# Patient Record
Sex: Female | Born: 1985 | Race: Black or African American | Hispanic: No | Marital: Single | Smoking: Current some day smoker
Health system: Southern US, Community
[De-identification: ages and names within clinical notes are randomized; demographics above are authoritative.]

## PROBLEM LIST (undated history)

## (undated) DIAGNOSIS — I10 Essential (primary) hypertension: Secondary | ICD-10-CM

## (undated) HISTORY — PX: HERNIA REPAIR: SHX51

## (undated) HISTORY — PX: TONSILLECTOMY: SUR1361

---

## 2007-11-23 ENCOUNTER — Emergency Department: Payer: Self-pay | Admitting: Emergency Medicine

## 2009-11-09 ENCOUNTER — Emergency Department (HOSPITAL_BASED_OUTPATIENT_CLINIC_OR_DEPARTMENT_OTHER): Admission: EM | Admit: 2009-11-09 | Discharge: 2009-11-09 | Payer: Self-pay | Admitting: Emergency Medicine

## 2012-02-22 ENCOUNTER — Emergency Department (HOSPITAL_BASED_OUTPATIENT_CLINIC_OR_DEPARTMENT_OTHER)
Admission: EM | Admit: 2012-02-22 | Discharge: 2012-02-22 | Disposition: A | Discharge status: Home / Self Care | Payer: Medicaid Other | Attending: Emergency Medicine | Admitting: Emergency Medicine

## 2012-02-22 ENCOUNTER — Encounter (HOSPITAL_BASED_OUTPATIENT_CLINIC_OR_DEPARTMENT_OTHER): Payer: Self-pay

## 2012-02-22 DIAGNOSIS — B349 Viral infection, unspecified: Secondary | ICD-10-CM

## 2012-02-22 DIAGNOSIS — J029 Acute pharyngitis, unspecified: Secondary | ICD-10-CM

## 2012-02-22 DIAGNOSIS — R079 Chest pain, unspecified: Secondary | ICD-10-CM | POA: Insufficient documentation

## 2012-02-22 DIAGNOSIS — R42 Dizziness and giddiness: Secondary | ICD-10-CM | POA: Insufficient documentation

## 2012-02-22 LAB — RAPID STREP SCREEN (MED CTR MEBANE ONLY): Streptococcus, Group A Screen (Direct): NEGATIVE

## 2012-02-22 NOTE — ED Notes (Signed)
Pt reports not feeling well today.  She has chest pain, "light headedness" and a sore throat since 12:00 today.

## 2012-02-22 NOTE — Discharge Instructions (Signed)
Viral Infections  A viral infection can be caused by different types of viruses.Most viral infections are not serious and resolve on their own. However, some infections may cause severe symptoms and may lead to further complications.  SYMPTOMS  Viruses can frequently cause:   Minor sore throat.   Aches and pains.   Headaches.   Runny nose.   Different types of rashes.   Watery eyes.   Tiredness.   Cough.   Loss of appetite.   Gastrointestinal infections, resulting in nausea, vomiting, and diarrhea.  These symptoms do not respond to antibiotics because the infection is not caused by bacteria. However, you might catch a bacterial infection following the viral infection. This is sometimes called a "superinfection." Symptoms of such a bacterial infection may include:   Worsening sore throat with pus and difficulty swallowing.   Swollen neck glands.   Chills and a high or persistent fever.   Severe headache.   Tenderness over the sinuses.   Persistent overall ill feeling (malaise), muscle aches, and tiredness (fatigue).   Persistent cough.   Yellow, green, or brown mucus production with coughing.  HOME CARE INSTRUCTIONS    Only take over-the-counter or prescription medicines for pain, discomfort, diarrhea, or fever as directed by your caregiver.   Drink enough water and fluids to keep your urine clear or pale yellow. Sports drinks can provide valuable electrolytes, sugars, and hydration.   Get plenty of rest and maintain proper nutrition. Soups and broths with crackers or rice are fine.  SEEK IMMEDIATE MEDICAL CARE IF:    You have severe headaches, shortness of breath, chest pain, neck pain, or an unusual rash.   You have uncontrolled vomiting, diarrhea, or you are unable to keep down fluids.   You or your child has an oral temperature above 102 F (38.9 C), not controlled by medicine.   Your baby is older than 3 months with a rectal temperature of 102 F (38.9 C) or higher.   Your baby is 3  months old or younger with a rectal temperature of 100.4 F (38 C) or higher.  MAKE SURE YOU:    Understand these instructions.   Will watch your condition.   Will get help right away if you are not doing well or get worse.  Document Released: 06/20/2005 Document Revised: 08/30/2011 Document Reviewed: 01/15/2011  [employer] Patient Information 2012 [employer].

## 2012-02-22 NOTE — ED Provider Notes (Signed)
History     CSN: [id number]  Arrival date & time 02/22/12  1648   First MD Initiated Contact with Patient 02/22/12 1702      Chief Complaint  Patient presents with  . Chest Pain  . Dizziness    (Consider location/radiation/quality/duration/timing/severity/associated sxs/prior treatment) HPI Comments: Patient states started today with sore throat, tightness in chest, "just doesn't feel well".  No fevers or chills.  No n/v/d.  Denies shortness of breath  Patient is a 26 y.o. female presenting with chest pain. The history is provided by the patient.  Chest Pain Episode onset: this morning. Chest pain occurs constantly. The chest pain is worsening. The severity of the pain is moderate. The quality of the pain is described as aching. The pain does not radiate.     History reviewed. No pertinent past medical history.  History reviewed. No pertinent past surgical history.  No family history on file.  History  Substance Use Topics  . Smoking status: Passive Smoker  . Smokeless tobacco: Never Used  . Alcohol Use: Yes     occasional    OB History    Grav Para Term Preterm Abortions TAB SAB Ect Mult Living                  Review of Systems  Cardiovascular: Positive for chest pain.  All other systems reviewed and are negative.    Allergies  Review of patient's allergies indicates no known allergies.  Home Medications  No current outpatient prescriptions on file.  BP 125/78  Pulse 98  Temp(Src) 97.8 F (36.6 C) (Oral)  Resp 16  Ht 5\' 6"  (1.676 m)  Wt 224 lb (101.606 kg)  BMI 36.15 kg/m2  SpO2 98%  Physical Exam  Nursing note and vitals reviewed. Constitutional: She is oriented to person, place, and time. She appears well-developed and well-nourished. No distress.  HENT:  Head: Normocephalic and atraumatic.  Neck: Normal range of motion. Neck supple.  Cardiovascular: Normal rate and regular rhythm.  Exam reveals no gallop and no friction rub.   No murmur  heard. Pulmonary/Chest: Effort normal and breath sounds normal. No respiratory distress. She has no wheezes.  Abdominal: Soft. Bowel sounds are normal. She exhibits no distension. There is no tenderness.  Musculoskeletal: Normal range of motion.  Neurological: She is alert and oriented to person, place, and time.  Skin: Skin is warm and dry. She is not diaphoretic.    ED Course  Procedures (including critical care time)  Labs Reviewed - No data to display No results found.   No diagnosis found.   Date: 02/22/2012  Rate: 75  Rhythm: normal sinus rhythm  QRS Axis: normal  Intervals: normal  ST/T Wave abnormalities: normal  Conduction Disutrbances:none  Narrative Interpretation:   Old EKG Reviewed: none available    MDM  The strep test is negative.  The patient does not appear in any discomfort or distress.  She is not ill-appearing.  The ekg looks fine and the strep test is negative.  I am positive there is no cardiac issue and that her symptoms are most likely viral in nature.  She will be discharged with rest, time, and follow up if not improving.        Geoffery Lyons, MD 02/22/12 [phone]

## 2012-10-14 ENCOUNTER — Encounter (HOSPITAL_BASED_OUTPATIENT_CLINIC_OR_DEPARTMENT_OTHER): Payer: Self-pay | Admitting: *Deleted

## 2012-10-14 ENCOUNTER — Emergency Department (HOSPITAL_BASED_OUTPATIENT_CLINIC_OR_DEPARTMENT_OTHER)
Admission: EM | Admit: 2012-10-14 | Discharge: 2012-10-14 | Disposition: A | Discharge status: Home / Self Care | Payer: Self-pay | Attending: Emergency Medicine | Admitting: Emergency Medicine

## 2012-10-14 DIAGNOSIS — IMO0001 Reserved for inherently not codable concepts without codable children: Secondary | ICD-10-CM | POA: Insufficient documentation

## 2012-10-14 DIAGNOSIS — R059 Cough, unspecified: Secondary | ICD-10-CM | POA: Insufficient documentation

## 2012-10-14 DIAGNOSIS — R0982 Postnasal drip: Secondary | ICD-10-CM | POA: Insufficient documentation

## 2012-10-14 DIAGNOSIS — J029 Acute pharyngitis, unspecified: Secondary | ICD-10-CM | POA: Insufficient documentation

## 2012-10-14 DIAGNOSIS — R5381 Other malaise: Secondary | ICD-10-CM | POA: Insufficient documentation

## 2012-10-14 DIAGNOSIS — R52 Pain, unspecified: Secondary | ICD-10-CM | POA: Insufficient documentation

## 2012-10-14 DIAGNOSIS — R05 Cough: Secondary | ICD-10-CM | POA: Insufficient documentation

## 2012-10-14 NOTE — ED Provider Notes (Signed)
History     CSN: [id number]  Arrival date & time 10/14/12  0865   First MD Initiated Contact with Patient 10/14/12 [phone]      Chief Complaint  Patient presents with  . Sore Throat  . Generalized Body Aches    (Consider location/radiation/quality/duration/timing/severity/associated sxs/prior treatment) HPI Comments: Patient presents with a three-day history of sore throat and dry cough. She states her symptoms actually a little bit better this morning but she does find it checked out. She denies any nasal congestion although she's had a little bit of congestion in her throat. She denies any chest congestion but has had a dry cough. She denies any shortness of breath. She has had some subjective fevers and body aches. She's not sure whether the body aches are from the illness or a new workout classes she started 2 days ago. She denies any nausea vomiting or diarrhea.  Patient is a 27 y.o. female presenting with pharyngitis.  Sore Throat Pertinent negatives include no chest pain, no abdominal pain, no headaches and no shortness of breath.    History reviewed. No pertinent past medical history.  Past Surgical History  Procedure Date  . Tonsillectomy     History reviewed. No pertinent family history.  History  Substance Use Topics  . Smoking status: Passive Smoke Exposure - Never Smoker  . Smokeless tobacco: Never Used  . Alcohol Use: 2.0 oz/week    4 drink(s) per week    OB History    Grav Para Term Preterm Abortions TAB SAB Ect Mult Living                  Review of Systems  Constitutional: Positive for fatigue. Negative for fever, chills and diaphoresis.  HENT: Positive for sore throat and postnasal drip. Negative for congestion, rhinorrhea, sneezing and trouble swallowing.   Eyes: Negative.   Respiratory: Negative for cough, chest tightness and shortness of breath.   Cardiovascular: Negative for chest pain and leg swelling.  Gastrointestinal: Negative for nausea,  vomiting, abdominal pain, diarrhea and blood in stool.  Genitourinary: Negative for frequency, hematuria, flank pain and difficulty urinating.  Musculoskeletal: Positive for myalgias. Negative for back pain and arthralgias.  Skin: Negative for rash.  Neurological: Negative for dizziness, speech difficulty, weakness, numbness and headaches.    Allergies  Review of patient's allergies indicates no known allergies.  Home Medications   Current Outpatient Rx  Name  Route  Sig  Dispense  Refill  . MULTIVITAMINS PO CAPS   Oral   Take 1 capsule by mouth daily.           BP 139/84  Pulse 74  Temp 98.6 F (37 C) (Oral)  Resp 18  Ht 5\' 5"  (1.651 m)  Wt 239 lb (108.41 kg)  BMI 39.77 kg/m2  SpO2 100%  Physical Exam  Constitutional: She is oriented to person, place, and time. She appears well-developed and well-nourished.  HENT:  Head: Normocephalic and atraumatic.  Mouth/Throat: Oropharynx is clear and moist.  Eyes: Pupils are equal, round, and reactive to light.  Neck: Normal range of motion. Neck supple.  Cardiovascular: Normal rate, regular rhythm and normal heart sounds.   Pulmonary/Chest: Effort normal and breath sounds normal. No respiratory distress. She has no wheezes. She has no rales. She exhibits no tenderness.  Abdominal: Soft. Bowel sounds are normal. There is no tenderness. There is no rebound and no guarding.  Musculoskeletal: Normal range of motion. She exhibits no edema.  Lymphadenopathy:  She has no cervical adenopathy.  Neurological: She is alert and oriented to person, place, and time.  Skin: Skin is warm and dry. No rash noted.  Psychiatric: She has a normal mood and affect.    ED Course  Procedures (including critical care time)  Results for orders placed during the hospital encounter of 10/14/12  RAPID STREP SCREEN      Component Value Range   Streptococcus, Group A Screen (Direct) NEGATIVE  NEGATIVE   No results found.   No results  found.   1. Pharyngitis       MDM  Patient presents with viral type symptoms. She is well-appearing with no fever and no shortness of breath. Her strep test is negative. She's out of the window for Tamiflu and I would not feel this is indicated even she was in the window because her symptoms seemed to be improving. I advised in symptomatic care and advised to return if her symptoms worsen or she has any shortness of breath.        Rolan Bucco, MD 10/14/12 [phone]

## 2012-10-14 NOTE — ED Notes (Signed)
Pt d/c home- no new rx given 

## 2012-10-14 NOTE — ED Notes (Signed)
MD at bedside. 

## 2012-10-14 NOTE — ED Notes (Signed)
Sore throat and cough states no fever feels better today states she has had some body aches but also took a new exercise class on Saturday too

## 2012-11-17 ENCOUNTER — Emergency Department (HOSPITAL_BASED_OUTPATIENT_CLINIC_OR_DEPARTMENT_OTHER)
Admission: EM | Admit: 2012-11-17 | Discharge: 2012-11-17 | Disposition: A | Discharge status: Home / Self Care | Payer: Self-pay | Attending: Emergency Medicine | Admitting: Emergency Medicine

## 2012-11-17 ENCOUNTER — Encounter (HOSPITAL_BASED_OUTPATIENT_CLINIC_OR_DEPARTMENT_OTHER): Payer: Self-pay | Admitting: *Deleted

## 2012-11-17 DIAGNOSIS — Y939 Activity, unspecified: Secondary | ICD-10-CM | POA: Insufficient documentation

## 2012-11-17 DIAGNOSIS — Y929 Unspecified place or not applicable: Secondary | ICD-10-CM | POA: Insufficient documentation

## 2012-11-17 DIAGNOSIS — W57XXXA Bitten or stung by nonvenomous insect and other nonvenomous arthropods, initial encounter: Secondary | ICD-10-CM | POA: Insufficient documentation

## 2012-11-17 DIAGNOSIS — T148 Other injury of unspecified body region: Secondary | ICD-10-CM | POA: Insufficient documentation

## 2012-11-17 MED ORDER — TRIAMCINOLONE ACETONIDE 0.1 % EX CREA: TOPICAL_CREAM | Freq: Two times a day (BID) | CUTANEOUS | Status: DC

## 2012-11-17 NOTE — ED Provider Notes (Signed)
History    This chart was scribed for non-physician practitioner working with Derwood Kaplan, MD by Donne Anon, ED Scribe. This patient was seen in room MH03/MH03 and the patient's care was started at 1634.    CSN: [id number]  Arrival date & time 11/17/12  1530   First MD Initiated Contact with Patient 11/17/12 1634      Chief Complaint  Patient presents with  . Rash     The history is provided by the patient. No language interpreter was used.  Virginia Wagner is a 27 y.o. female who presents to the Emergency Department complaining of gradual onset, gradually worsening, constant, moderate rash which began on her left hand 4 days ago and yesterday spread to her left arm and left ankle. She reports associated itching. She denies any other pain. She denies any insect bites. 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   History reviewed. No pertinent past medical history.  Past Surgical History  Procedure Laterality Date  . Tonsillectomy    . Hernia repair      No family history on file.  History  Substance Use Topics  . Smoking status: Passive Smoke Exposure - Never Smoker  . Smokeless tobacco: Never Used  . Alcohol Use: 2.0 oz/week    4 drink(s) per week    Review of Systems  Skin: Positive for rash.  All other systems reviewed and are negative.    Allergies  Review of patient's allergies indicates no known allergies.  Home Medications   Current Outpatient Rx  Name  Route  Sig  Dispense  Refill  . Multiple Vitamin (MULTIVITAMIN) capsule   Oral   Take 1 capsule by mouth daily.           BP 141/87  Pulse 93  Temp(Src) 98.8 F (37.1 C) (Oral)  Resp 18  SpO2 100%  Physical Exam  Nursing note and vitals reviewed. Constitutional: She is oriented to person, place, and time. She appears well-developed and well-nourished. No distress.  HENT:  Head: Normocephalic and atraumatic.  Eyes:  EOM are normal.  Neck: Neck supple. No tracheal deviation present.  Cardiovascular: Normal rate.   Pulmonary/Chest: Effort normal. No respiratory distress.  Musculoskeletal: Normal range of motion.  Neurological: She is alert and oriented to person, place, and time.  Skin: Skin is warm and dry.  Red raised dime sized areas; 3 on left arm, 1 dime sized area on left ankle. 2 pinpoint areas on left 5th finger.  Psychiatric: She has a normal mood and affect. Her behavior is normal.    ED Course  Procedures (including critical care time) DIAGNOSTIC STUDIES: Oxygen Saturation is 100% on room air, normal by my interpretation.    COORDINATION OF CARE: 5:31 PM Discussed treatment plan which includes Benadryl and cortisone cream with pt at bedside and pt agreed to plan.     Labs Reviewed - No data to display No results found.   1. Insect bites     triamcinolone to rash  MDM   I personally performed the services in this documentation, which was scribed in my presence.  The recorded information has been reviewed and considered.   Barnet Pall.        Lonia Skinner Roy, [place] 11/17/12 [phone]

## 2012-11-17 NOTE — ED Notes (Signed)
Pt reports she noticed bumps on her left hand on Friday- now on left arm and left ankle- c/o itching, ?insect bites

## 2012-11-18 NOTE — ED Provider Notes (Signed)
Medical screening examination/treatment/procedure(s) were performed by non-physician practitioner and as supervising physician I was immediately available for consultation/collaboration.  Brock Mokry, MD 11/18/12 0014 

## 2012-12-01 ENCOUNTER — Encounter (HOSPITAL_BASED_OUTPATIENT_CLINIC_OR_DEPARTMENT_OTHER): Payer: Self-pay | Admitting: Family Medicine

## 2012-12-01 ENCOUNTER — Emergency Department (HOSPITAL_BASED_OUTPATIENT_CLINIC_OR_DEPARTMENT_OTHER)
Admission: EM | Admit: 2012-12-01 | Discharge: 2012-12-01 | Disposition: A | Discharge status: Home / Self Care | Payer: Self-pay | Attending: Emergency Medicine | Admitting: Emergency Medicine

## 2012-12-01 DIAGNOSIS — F17200 Nicotine dependence, unspecified, uncomplicated: Secondary | ICD-10-CM | POA: Insufficient documentation

## 2012-12-01 DIAGNOSIS — M542 Cervicalgia: Secondary | ICD-10-CM | POA: Insufficient documentation

## 2012-12-01 DIAGNOSIS — Z79899 Other long term (current) drug therapy: Secondary | ICD-10-CM | POA: Insufficient documentation

## 2012-12-01 MED ORDER — IBUPROFEN 600 MG PO TABS: 600.0000 mg | ORAL_TABLET | Freq: Three times a day (TID) | ORAL | Status: DC | PRN

## 2012-12-01 MED ORDER — IBUPROFEN 400 MG PO TABS
600.0000 mg | ORAL_TABLET | Freq: Once | ORAL | Status: AC
  Administered 2012-12-01: 600 mg via ORAL
  Filled 2012-12-01: qty 1

## 2012-12-01 NOTE — ED Provider Notes (Signed)
History     CSN: [id number]  Arrival date & time 12/01/12  1038   First MD Initiated Contact with Patient 12/01/12 1125      Chief Complaint  Patient presents with  . Neck Pain    (Consider location/radiation/quality/duration/timing/severity/associated sxs/prior treatment) Patient is a 27 y.o. female presenting with neck pain. The history is provided by the patient.  Neck Pain Associated symptoms: no chest pain, no fever and no headaches   pt c/o right anterior neck pain for the past couple days. Gradual onset. Mild to mod. Constant.  No abrupt change today. States occasionally worse w turning neck side to side or with pressure on area. No visible swelling. No skin changes or erythema. Denies sore throat or trouble swallowing. No change in voice. No cough or trouble breathing. No dental pain or pain chewing. No ear pain. Denies hx same.    History reviewed. No pertinent past medical history.  Past Surgical History  Procedure Laterality Date  . Tonsillectomy    . Hernia repair      No family history on file.  History  Substance Use Topics  . Smoking status: Current Every Day Smoker  . Smokeless tobacco: Never Used  . Alcohol Use: 2.0 oz/week    4 drink(s) per week    OB History   Grav Para Term Preterm Abortions TAB SAB Ect Mult Living                  Review of Systems  Constitutional: Negative for fever.  HENT: Positive for neck pain. Negative for sore throat, trouble swallowing, dental problem and voice change.   Eyes: Negative for pain and redness.  Respiratory: Negative for cough and shortness of breath.   Cardiovascular: Negative for chest pain.  Gastrointestinal: Negative for nausea and vomiting.  Skin: Negative for rash.  Neurological: Negative for headaches.    Allergies  Review of patient's allergies indicates no known allergies.  Home Medications   Current Outpatient Rx  Name  Route  Sig  Dispense  Refill  . acetaminophen (TYLENOL) 325 MG  tablet   Oral   Take 650 mg by mouth every 6 (six) hours as needed for pain.         . Multiple Vitamin (MULTIVITAMIN) capsule   Oral   Take 1 capsule by mouth daily.         Marland Kitchen triamcinolone cream (KENALOG) 0.1 %   Topical   Apply topically 2 (two) times daily.   30 g   0     BP 133/84  Pulse 80  Temp(Src) 98.3 F (36.8 C) (Oral)  Resp 20  Ht 5\' 5"  (1.651 m)  Wt 234 lb (106.142 kg)  BMI 38.94 kg/m2  SpO2 99%  Physical Exam  Nursing note and vitals reviewed. Constitutional: She appears well-developed and well-nourished. No distress.  HENT:  Nose: Nose normal.  Mouth/Throat: Oropharynx is clear and moist.  No pharyngeal swelling. No trismus. No dental pain or tenderness.   Eyes: Conjunctivae are normal. No scleral icterus.  Neck: Normal range of motion. Neck supple. No tracheal deviation present. No thyromegaly present.  ?mildly tender right anterior cervical, submandibular lymph node. No mass or abscess felt.   Cardiovascular: Normal rate.   Pulmonary/Chest: Effort normal and breath sounds normal. No respiratory distress.  No stridor  Abdominal: Normal appearance. She exhibits no distension.  Musculoskeletal: She exhibits no edema.  Neurological: She is alert.  Skin: Skin is warm and dry. No rash noted. She  is not diaphoretic.  Psychiatric: She has a normal mood and affect.    ED Course  Procedures (including critical care time)    MDM  Motrin po.  No abscess. No cellulitis. No mass.  Discussed diff dx incl possible lymphadenitis ?viral. No throat pain or swelling. No dental pain. No ear pain. No trouble breathing or swallowing, no change in voice.  Pt appears stable for d/c.        Suzi Roots, MD 12/01/12 1146

## 2012-12-01 NOTE — ED Notes (Signed)
Pt c/o right sided neck pain, tender to palpation since Friday. Pt sts neck feels swollen.

## 2012-12-14 ENCOUNTER — Emergency Department (HOSPITAL_BASED_OUTPATIENT_CLINIC_OR_DEPARTMENT_OTHER): Payer: Self-pay

## 2012-12-14 ENCOUNTER — Emergency Department (HOSPITAL_BASED_OUTPATIENT_CLINIC_OR_DEPARTMENT_OTHER)
Admission: EM | Admit: 2012-12-14 | Discharge: 2012-12-14 | Disposition: A | Discharge status: Home / Self Care | Payer: Self-pay | Attending: Emergency Medicine | Admitting: Emergency Medicine

## 2012-12-14 ENCOUNTER — Encounter (HOSPITAL_BASED_OUTPATIENT_CLINIC_OR_DEPARTMENT_OTHER): Payer: Self-pay | Admitting: *Deleted

## 2012-12-14 DIAGNOSIS — Z792 Long term (current) use of antibiotics: Secondary | ICD-10-CM | POA: Insufficient documentation

## 2012-12-14 DIAGNOSIS — Z79899 Other long term (current) drug therapy: Secondary | ICD-10-CM | POA: Insufficient documentation

## 2012-12-14 DIAGNOSIS — F17200 Nicotine dependence, unspecified, uncomplicated: Secondary | ICD-10-CM | POA: Insufficient documentation

## 2012-12-14 DIAGNOSIS — X500XXA Overexertion from strenuous movement or load, initial encounter: Secondary | ICD-10-CM | POA: Insufficient documentation

## 2012-12-14 DIAGNOSIS — Y9301 Activity, walking, marching and hiking: Secondary | ICD-10-CM | POA: Insufficient documentation

## 2012-12-14 DIAGNOSIS — Y929 Unspecified place or not applicable: Secondary | ICD-10-CM | POA: Insufficient documentation

## 2012-12-14 DIAGNOSIS — S93409A Sprain of unspecified ligament of unspecified ankle, initial encounter: Secondary | ICD-10-CM | POA: Insufficient documentation

## 2012-12-14 DIAGNOSIS — W19XXXA Unspecified fall, initial encounter: Secondary | ICD-10-CM | POA: Insufficient documentation

## 2012-12-14 MED ORDER — HYDROCODONE-ACETAMINOPHEN 5-325 MG PO TABS: 1.0000 | ORAL_TABLET | Freq: Four times a day (QID) | ORAL | Status: DC | PRN

## 2012-12-14 MED ORDER — IBUPROFEN 800 MG PO TABS
ORAL_TABLET | ORAL | Status: AC
  Filled 2012-12-14: qty 1

## 2012-12-14 MED ORDER — IBUPROFEN 800 MG PO TABS
800.0000 mg | ORAL_TABLET | Freq: Once | ORAL | Status: AC
  Administered 2012-12-14: 800 mg via ORAL

## 2012-12-14 NOTE — ED Notes (Signed)
Patient twisted her R ankle last night and pain has grown worse

## 2012-12-14 NOTE — ED Provider Notes (Addendum)
History     CSN: [id number]  Arrival date & time 12/14/12  4782   First MD Initiated Contact with Patient 12/14/12 1007      Chief Complaint  Patient presents with  . Ankle Pain    (Consider location/radiation/quality/duration/timing/severity/associated sxs/prior treatment) Patient is a 27 y.o. female presenting with ankle pain. The history is provided by the patient.  Ankle Pain Location:  Ankle Time since incident:  1 day Injury: yes   Mechanism of injury comment:  Walking in heels and her foot twisted outward and she fell Ankle location:  R ankle Pain details:    Quality:  Aching, shooting, throbbing and sharp   Radiates to:  Does not radiate   Severity:  Moderate   Onset quality:  Sudden   Duration:  1 day   Timing:  Constant   Progression:  Unchanged Chronicity:  New Prior injury to area:  No Relieved by:  Rest Worsened by:  Activity and bearing weight Ineffective treatments:  None tried Associated symptoms: swelling   Associated symptoms: no numbness     History reviewed. No pertinent past medical history.  Past Surgical History  Procedure Laterality Date  . Tonsillectomy    . Hernia repair      No family history on file.  History  Substance Use Topics  . Smoking status: Current Every Day Smoker  . Smokeless tobacco: Never Used  . Alcohol Use: 2.0 oz/week    4 drink(s) per week    OB History   Grav Para Term Preterm Abortions TAB SAB Ect Mult Living                  Review of Systems  Neurological: Negative for weakness and numbness.  All other systems reviewed and are negative.    Allergies  Review of patient's allergies indicates no known allergies.  Home Medications   Current Outpatient Rx  Name  Route  Sig  Dispense  Refill  . CLINDAMYCIN HCL PO   Oral   Take by mouth.         Marland Kitchen acetaminophen (TYLENOL) 325 MG tablet   Oral   Take 650 mg by mouth every 6 (six) hours as needed for pain.         Marland Kitchen ibuprofen (ADVIL,MOTRIN)  600 MG tablet   Oral   Take 1 tablet (600 mg total) by mouth every 8 (eight) hours as needed for pain. Take with food.   20 tablet   0   . Multiple Vitamin (MULTIVITAMIN) capsule   Oral   Take 1 capsule by mouth daily.         Marland Kitchen triamcinolone cream (KENALOG) 0.1 %   Topical   Apply topically 2 (two) times daily.   30 g   0     BP 129/81  Pulse 93  Temp(Src) 98.5 F (36.9 C) (Oral)  Resp 18  SpO2 99%  Physical Exam  Constitutional: She appears well-developed and well-nourished. No distress.  HENT:  Head: Normocephalic and atraumatic.  Eyes: EOM are normal. Pupils are equal, round, and reactive to light.  Cardiovascular: Normal rate.   Pulmonary/Chest: Effort normal.  Musculoskeletal:       Right ankle: She exhibits swelling. She exhibits normal range of motion, no deformity and normal pulse. No lateral malleolus, no medial malleolus, no posterior TFL, no head of 5th metatarsal and no proximal fibula tenderness found.       Feet:  Neurological: She is alert.  Skin: Skin is  warm and dry. No rash noted. No erythema.    ED Course  Procedures (including critical care time)  Labs Reviewed - No data to display Dg Ankle Complete Right  12/14/2012  *RADIOLOGY REPORT*  Clinical Data: Pain post injury.  RIGHT ANKLE - COMPLETE 3+ VIEW  Comparison: None.  Findings: Ankle mortise intact. Negative for fracture, dislocation, or other acute abnormality.  Normal alignment and mineralization. No significant degenerative change.  Regional soft tissues unremarkable.  IMPRESSION:  Negative   Original Report Authenticated By: D. Andria Rhein, MD      1. Ankle sprain and strain, right, initial encounter       MDM   Patient with a mechanical injury to her right ankle last night while she was wearing heels and fell. She has pain over the fibulotalar ligament with swelling. No lateral or medial malleolar tenderness. No tenderness at the base of her fifth metatarsal. Neurovascularly  intact. No fibular head pain.  Films are negative for acute injury and feel this is a ligamental. Patient placed in an ASO and given crutches and pain control.      Gwyneth Sprout, MD 12/14/12 1037  Gwyneth Sprout, MD 12/14/12 1038

## 2012-12-23 NOTE — ED Notes (Signed)
Ms. Virginia Wagner's boss, Virginia Wagner, called to question work note.  A note was given to the pt on 12/22/12 which Dr. Anitra Lauth had hand written on the note that is was ok for the pt to return to work on 12/22/12 with no restrictions.  Note was signed by myself and Dr. Anitra Lauth.

## 2013-06-14 ENCOUNTER — Encounter (HOSPITAL_BASED_OUTPATIENT_CLINIC_OR_DEPARTMENT_OTHER): Payer: Self-pay | Admitting: *Deleted

## 2013-06-14 ENCOUNTER — Emergency Department (HOSPITAL_BASED_OUTPATIENT_CLINIC_OR_DEPARTMENT_OTHER): Payer: Medicaid Other

## 2013-06-14 ENCOUNTER — Emergency Department (HOSPITAL_BASED_OUTPATIENT_CLINIC_OR_DEPARTMENT_OTHER)
Admission: EM | Admit: 2013-06-14 | Discharge: 2013-06-14 | Disposition: A | Discharge status: Home / Self Care | Payer: Medicaid Other | Attending: Emergency Medicine | Admitting: Emergency Medicine

## 2013-06-14 DIAGNOSIS — F17200 Nicotine dependence, unspecified, uncomplicated: Secondary | ICD-10-CM | POA: Insufficient documentation

## 2013-06-14 DIAGNOSIS — M549 Dorsalgia, unspecified: Secondary | ICD-10-CM

## 2013-06-14 DIAGNOSIS — R079 Chest pain, unspecified: Secondary | ICD-10-CM | POA: Insufficient documentation

## 2013-06-14 MED ORDER — NAPROXEN 500 MG PO TABS: 500.0000 mg | ORAL_TABLET | Freq: Two times a day (BID) | ORAL | Status: DC

## 2013-06-14 MED ORDER — HYDROCODONE-ACETAMINOPHEN 5-325 MG PO TABS: 1.0000 | ORAL_TABLET | Freq: Four times a day (QID) | ORAL | Status: DC | PRN

## 2013-06-14 MED ORDER — CYCLOBENZAPRINE HCL 10 MG PO TABS: 10.0000 mg | ORAL_TABLET | Freq: Two times a day (BID) | ORAL | Status: DC | PRN

## 2013-06-14 NOTE — ED Notes (Signed)
Patient states that she has been having back pain over the past year but this morning while at work, her back and R shoulder have been hurting more than usual. Put a lidocane patch on this AM but no relief

## 2013-06-14 NOTE — ED Provider Notes (Addendum)
CSN: [id number]     Arrival date & time 06/14/13  0701 History   First MD Initiated Contact with Patient 06/14/13 [phone]     Chief Complaint  Patient presents with  . Back Pain   (Consider location/radiation/quality/duration/timing/severity/associated sxs/prior Treatment) The history is provided by the patient.   27 year old female with onset of mid thoracic back pain last evening no injury. Pain is 8/10 sharp in nature worse with taking a deep breath. No history of pain in that area in the past. Pain is nonradiating.  History reviewed. No pertinent past medical history. Past Surgical History  Procedure Laterality Date  . Tonsillectomy    . Hernia repair     No family history on file. History  Substance Use Topics  . Smoking status: Current Every Day Smoker  . Smokeless tobacco: Never Used  . Alcohol Use: 2.0 oz/week    4 drink(s) per week   OB History   Grav Para Term Preterm Abortions TAB SAB Ect Mult Living                 Review of Systems  Constitutional: Negative for fever.  HENT: Negative for congestion and neck pain.   Eyes: Negative for redness.  Respiratory: Negative for shortness of breath.   Cardiovascular: Positive for chest pain. Negative for leg swelling.  Gastrointestinal: Negative for abdominal pain.  Musculoskeletal: Positive for back pain.  Skin: Negative for rash.  Neurological: Negative for weakness, numbness and headaches.  Hematological: Does not bruise/bleed easily.  Psychiatric/Behavioral: Negative for confusion.    Allergies  Review of patient's allergies indicates no known allergies.  Home Medications   Current Outpatient Rx  Name  Route  Sig  Dispense  Refill  . acetaminophen (TYLENOL) 325 MG tablet   Oral   Take 650 mg by mouth every 6 (six) hours as needed for pain.         Marland Kitchen CLINDAMYCIN HCL PO   Oral   Take by mouth.         . cyclobenzaprine (FLEXERIL) 10 MG tablet   Oral   Take 1 tablet (10 mg total) by mouth 2 (two)  times daily as needed for muscle spasms.   20 tablet   0   . HYDROcodone-acetaminophen (NORCO/VICODIN) 5-325 MG per tablet   Oral   Take 1 tablet by mouth every 6 (six) hours as needed for pain.   15 tablet   0   . HYDROcodone-acetaminophen (NORCO/VICODIN) 5-325 MG per tablet   Oral   Take 1-2 tablets by mouth every 6 (six) hours as needed for pain.   10 tablet   0   . ibuprofen (ADVIL,MOTRIN) 600 MG tablet   Oral   Take 1 tablet (600 mg total) by mouth every 8 (eight) hours as needed for pain. Take with food.   20 tablet   0   . Multiple Vitamin (MULTIVITAMIN) capsule   Oral   Take 1 capsule by mouth daily.         . naproxen (NAPROSYN) 500 MG tablet   Oral   Take 1 tablet (500 mg total) by mouth 2 (two) times daily.   14 tablet   0   . triamcinolone cream (KENALOG) 0.1 %   Topical   Apply topically 2 (two) times daily.   30 g   0    BP 125/76  Pulse 76  Temp(Src) 98.3 F (36.8 C) (Oral)  Resp 18  SpO2 100% Physical Exam  Nursing note and  vitals reviewed. Constitutional: She is oriented to person, place, and time. She appears well-developed and well-nourished.  HENT:  Head: Normocephalic and atraumatic.  Mouth/Throat: Oropharynx is clear and moist.  Eyes: Conjunctivae and EOM are normal. Pupils are equal, round, and reactive to light.  Neck: Normal range of motion.  Cardiovascular: Normal rate, regular rhythm and normal heart sounds.   No murmur heard. Pulmonary/Chest: Effort normal and breath sounds normal. No respiratory distress. She has no wheezes. She has no rales. She exhibits no tenderness.  Abdominal: Soft. Bowel sounds are normal. There is no tenderness.  Musculoskeletal: Normal range of motion.  Neurological: She is alert and oriented to person, place, and time. No cranial nerve deficit. She exhibits normal muscle tone. Coordination normal.  Skin: Skin is warm. No rash noted.    ED Course  Procedures (including critical care time) Labs  Review Labs Reviewed - No data to display Imaging Review Dg Chest 2 View  06/14/2013   *RADIOLOGY REPORT*  Clinical Data: Back pain, worse with deep inspiration.  CHEST - 2 VIEW  Comparison:  None.  Views: PA and lateral views.  FINDINGS: There is no focal infiltrate, pulmonary edema, or pleural effusion. The mediastinal contour and cardiac silhouette are normal. The soft tissue and osseous structures are normal.  IMPRESSION: No acute cardiopulmonary disease identified.   Original Report Authenticated By: Sherian Rein, M.D.    MDM   1. Back pain      Patient with complaint of back pain. The pain is worse with inspiration some pits in the mid thoracic area chest x-ray will be done to rule out the pneumonia or pneumothorax.  Chest x-ray negative will treat as if it's musculoskeletal back pain. History shows no evidence of pneumonia or pneumothorax.  Shelda Jakes, MD 06/14/13 1610  Shelda Jakes, MD 06/14/13 [phone]

## 2013-11-15 ENCOUNTER — Emergency Department (HOSPITAL_BASED_OUTPATIENT_CLINIC_OR_DEPARTMENT_OTHER): Payer: Medicaid Other

## 2013-11-15 ENCOUNTER — Emergency Department (HOSPITAL_BASED_OUTPATIENT_CLINIC_OR_DEPARTMENT_OTHER)
Admission: EM | Admit: 2013-11-15 | Discharge: 2013-11-15 | Disposition: A | Discharge status: Home / Self Care | Payer: Medicaid Other | Attending: Emergency Medicine | Admitting: Emergency Medicine

## 2013-11-15 ENCOUNTER — Encounter (HOSPITAL_BASED_OUTPATIENT_CLINIC_OR_DEPARTMENT_OTHER): Payer: Self-pay | Admitting: Emergency Medicine

## 2013-11-15 DIAGNOSIS — J189 Pneumonia, unspecified organism: Secondary | ICD-10-CM

## 2013-11-15 DIAGNOSIS — J159 Unspecified bacterial pneumonia: Secondary | ICD-10-CM | POA: Insufficient documentation

## 2013-11-15 DIAGNOSIS — R0602 Shortness of breath: Secondary | ICD-10-CM | POA: Insufficient documentation

## 2013-11-15 DIAGNOSIS — R000 Tachycardia, unspecified: Secondary | ICD-10-CM | POA: Insufficient documentation

## 2013-11-15 DIAGNOSIS — F17200 Nicotine dependence, unspecified, uncomplicated: Secondary | ICD-10-CM | POA: Insufficient documentation

## 2013-11-15 MED ORDER — PREDNISONE 50 MG PO TABS: 50.0000 mg | ORAL_TABLET | Freq: Every day | ORAL | Status: DC

## 2013-11-15 MED ORDER — AZITHROMYCIN 250 MG PO TABS: 250.0000 mg | ORAL_TABLET | Freq: Every day | ORAL | Status: DC

## 2013-11-15 MED ORDER — ALBUTEROL SULFATE HFA 108 (90 BASE) MCG/ACT IN AERS
2.0000 | INHALATION_SPRAY | RESPIRATORY_TRACT | Status: DC | PRN
  Administered 2013-11-15: 2 via RESPIRATORY_TRACT
  Filled 2013-11-15: qty 6.7

## 2013-11-15 MED ORDER — ALBUTEROL SULFATE HFA 108 (90 BASE) MCG/ACT IN AERS: 2.0000 | INHALATION_SPRAY | RESPIRATORY_TRACT | Status: DC | PRN

## 2013-11-15 NOTE — ED Notes (Signed)
Patient here with cough and congestion x 3 days, denies fever. Patient states cough worse at night, used a family members inhaler last night and did get some relief

## 2013-11-15 NOTE — Discharge Instructions (Signed)

## 2013-11-15 NOTE — ED Provider Notes (Signed)
CSN: [id number]     Arrival date & time 11/15/13  0915 History   First MD Initiated Contact with Patient 11/15/13 [phone]     Chief Complaint  Patient presents with  . Cough  . Nasal Congestion     (Consider location/radiation/quality/duration/timing/severity/associated sxs/prior Treatment) HPI  This is a 28 year old female with no significant past medical history who presents with 3 days of cough and congestion. Patient states she had onset of symptoms 3 days ago. She reports cough has been dry. She denies any fevers or chest pain. She does endorse shortness of breath. Shortness of breath was improved after she used a family members inhaler last night. She does have a smoking history but no known history of asthma. She denies any other symptoms at this time including headache, abdominal pain, nausea, vomiting, diarrhea, urinary symptoms.  History reviewed. No pertinent past medical history. Past Surgical History  Procedure Laterality Date  . Tonsillectomy    . Hernia repair     No family history on file. History  Substance Use Topics  . Smoking status: Current Every Day Smoker -- 0.50 packs/day    Types: Cigarettes  . Smokeless tobacco: Never Used  . Alcohol Use: 2.0 oz/week    4 drink(s) per week   OB History   Grav Para Term Preterm Abortions TAB SAB Ect Mult Living                 Review of Systems  Constitutional: Negative for fever.  HENT: Positive for congestion. Negative for ear pain and sore throat.   Respiratory: Positive for cough and shortness of breath. Negative for chest tightness.   Cardiovascular: Negative for chest pain.  Gastrointestinal: Negative for nausea, vomiting and abdominal pain.  Genitourinary: Negative for dysuria.  Skin: Negative for rash.  Neurological: Negative for headaches.  All other systems reviewed and are negative.      Allergies  Review of patient's allergies indicates no known allergies.  Home Medications   Current Outpatient  Rx  Name  Route  Sig  Dispense  Refill  . albuterol (PROVENTIL HFA;VENTOLIN HFA) 108 (90 BASE) MCG/ACT inhaler   Inhalation   Inhale 2 puffs into the lungs every 4 (four) hours as needed for wheezing or shortness of breath.   1 Inhaler   0   . azithromycin (ZITHROMAX) 250 MG tablet   Oral   Take 1 tablet (250 mg total) by mouth daily. Take first 2 tablets together, then 1 every day until finished.   6 tablet   0   . predniSONE (DELTASONE) 50 MG tablet   Oral   Take 1 tablet (50 mg total) by mouth daily.   5 tablet   0    BP 125/85  Pulse 114  Temp(Src) 98.8 F (37.1 C) (Oral)  Resp 20  Ht 5\' 4"  (1.626 m)  Wt 230 lb (104.327 kg)  BMI 39.46 kg/m2  SpO2 98% Physical Exam  Nursing note and vitals reviewed. Constitutional: She is oriented to person, place, and time. She appears well-developed and well-nourished. No distress.  HENT:  Head: Normocephalic and atraumatic.  Mouth/Throat: Oropharynx is clear and moist.  Eyes: Pupils are equal, round, and reactive to light.  Neck: Neck supple.  Cardiovascular: Regular rhythm and normal heart sounds.   Tachycardia   Pulmonary/Chest: Effort normal. No respiratory distress. She has wheezes. She has no rales.  Abdominal: Soft. There is no tenderness.  Musculoskeletal: She exhibits no edema.  Neurological: She is alert and oriented  to person, place, and time.  Skin: Skin is warm and dry.  Psychiatric: She has a normal mood and affect.    ED Course  Procedures (including critical care time) Labs Review Labs Reviewed - No data to display Imaging Review Dg Chest 2 View  11/15/2013   CLINICAL DATA:  Patient here with cough and congestion for 3 days. No fever  EXAM: CHEST  2 VIEW  COMPARISON:  06/14/2013  FINDINGS: Patchy asymmetric airspace opacity is identified within the perihilar right upper lobe. Remaining lungs are clear. Heart size and mediastinal contours are normal. The skeletal structures appear intact.  IMPRESSION: Right  upper lobe opacity consistent with pneumonia.   Electronically Signed   By: Signa Kellaylor  Stroud M.D.   On: 11/15/2013 10:37    EKG Interpretation   None       MDM   Final diagnoses:  Community acquired pneumonia    Patient presents with cough, shortness of breath, and congestion. She was afebrile on exam. Vital signs are notable for mild tachycardia at 114. She did use an inhaler prior to arrival. She is wheezing on exam. She is in no respiratory distress. She is a smoker. Chest x-ray concerning for right upper lobe opacity. I have reviewed the films.  Getting smoking history, patient was given prednisone and an inhaler at discharge. She had improvement with inhaler in the ER. Patient will also be given azithromycin. She is young and low risk pneumonia complications.  Patient was advised to return if she has any worsening symptoms.  After history, exam, and medical workup I feel the patient has been appropriately medically screened and is safe for discharge home. Pertinent diagnoses were discussed with the patient. Patient was given return precautions.   Shon Batonourtney F Horton, MD 11/15/13 [phone]

## 2013-11-20 NOTE — ED Notes (Signed)
Pt presented to ED for a work note. Pt requesting note to keep her out until meds complete. Pt given work note for 2 days per Dr. Bernette MayersSheldon.

## 2015-09-01 ENCOUNTER — Emergency Department (HOSPITAL_BASED_OUTPATIENT_CLINIC_OR_DEPARTMENT_OTHER)
Admission: EM | Admit: 2015-09-01 | Discharge: 2015-09-01 | Disposition: A | Discharge status: Home / Self Care | Payer: Self-pay | Attending: Emergency Medicine | Admitting: Emergency Medicine

## 2015-09-01 ENCOUNTER — Emergency Department (HOSPITAL_BASED_OUTPATIENT_CLINIC_OR_DEPARTMENT_OTHER): Payer: Medicaid Other

## 2015-09-01 ENCOUNTER — Encounter (HOSPITAL_BASED_OUTPATIENT_CLINIC_OR_DEPARTMENT_OTHER): Payer: Self-pay | Admitting: *Deleted

## 2015-09-01 ENCOUNTER — Other Ambulatory Visit: Payer: Self-pay

## 2015-09-01 DIAGNOSIS — R0602 Shortness of breath: Secondary | ICD-10-CM | POA: Insufficient documentation

## 2015-09-01 DIAGNOSIS — R42 Dizziness and giddiness: Secondary | ICD-10-CM | POA: Insufficient documentation

## 2015-09-01 DIAGNOSIS — Z3202 Encounter for pregnancy test, result negative: Secondary | ICD-10-CM | POA: Insufficient documentation

## 2015-09-01 DIAGNOSIS — R0789 Other chest pain: Secondary | ICD-10-CM | POA: Insufficient documentation

## 2015-09-01 DIAGNOSIS — R0781 Pleurodynia: Secondary | ICD-10-CM

## 2015-09-01 DIAGNOSIS — Z79899 Other long term (current) drug therapy: Secondary | ICD-10-CM | POA: Insufficient documentation

## 2015-09-01 DIAGNOSIS — R1012 Left upper quadrant pain: Secondary | ICD-10-CM | POA: Insufficient documentation

## 2015-09-01 DIAGNOSIS — F17210 Nicotine dependence, cigarettes, uncomplicated: Secondary | ICD-10-CM | POA: Insufficient documentation

## 2015-09-01 DIAGNOSIS — Z7952 Long term (current) use of systemic steroids: Secondary | ICD-10-CM | POA: Insufficient documentation

## 2015-09-01 LAB — CBC WITH DIFFERENTIAL/PLATELET
BASOS ABS: 0 10*3/uL (ref 0.0–0.1)
BASOS PCT: 1 %
EOS ABS: 0.2 10*3/uL (ref 0.0–0.7)
Eosinophils Relative: 4 %
HEMATOCRIT: 40.6 % (ref 36.0–46.0)
HEMOGLOBIN: 14 g/dL (ref 12.0–15.0)
Lymphocytes Relative: 37 %
Lymphs Abs: 1.3 10*3/uL (ref 0.7–4.0)
MCH: 31.6 pg (ref 26.0–34.0)
MCHC: 34.5 g/dL (ref 30.0–36.0)
MCV: 91.6 fL (ref 78.0–100.0)
MONOS PCT: 10 %
Monocytes Absolute: 0.3 10*3/uL (ref 0.1–1.0)
NEUTROS ABS: 1.7 10*3/uL (ref 1.7–7.7)
NEUTROS PCT: 48 %
Platelets: 240 10*3/uL (ref 150–400)
RBC: 4.43 MIL/uL (ref 3.87–5.11)
RDW: 11.7 % (ref 11.5–15.5)
WBC: 3.5 10*3/uL — AB (ref 4.0–10.5)

## 2015-09-01 LAB — COMPREHENSIVE METABOLIC PANEL
ALBUMIN: 4.1 g/dL (ref 3.5–5.0)
ALK PHOS: 37 U/L — AB (ref 38–126)
ALT: 16 U/L (ref 14–54)
ANION GAP: 7 (ref 5–15)
AST: 17 U/L (ref 15–41)
BILIRUBIN TOTAL: 0.6 mg/dL (ref 0.3–1.2)
BUN: 13 mg/dL (ref 6–20)
CALCIUM: 9.2 mg/dL (ref 8.9–10.3)
CO2: 27 mmol/L (ref 22–32)
Chloride: 104 mmol/L (ref 101–111)
Creatinine, Ser: 0.76 mg/dL (ref 0.44–1.00)
GFR calc Af Amer: >60 mL/min (ref >60)
GFR calc non Af Amer: >60 mL/min (ref >60)
GLUCOSE: 100 mg/dL — AB (ref 65–99)
Potassium: 3.8 mmol/L (ref 3.5–5.1)
SODIUM: 138 mmol/L (ref 135–145)
Total Protein: 7.5 g/dL (ref 6.5–8.1)

## 2015-09-01 LAB — LIPASE, BLOOD: Lipase: 23 U/L (ref 11–51)

## 2015-09-01 LAB — URINALYSIS, ROUTINE W REFLEX MICROSCOPIC
Bilirubin Urine: NEGATIVE
Glucose, UA: NEGATIVE mg/dL
Hgb urine dipstick: NEGATIVE
Ketones, ur: NEGATIVE mg/dL
LEUKOCYTES UA: NEGATIVE
Nitrite: NEGATIVE
PROTEIN: NEGATIVE mg/dL
SPECIFIC GRAVITY, URINE: 1.006 (ref 1.005–1.030)
pH: 7 (ref 5.0–8.0)

## 2015-09-01 LAB — PREGNANCY, URINE: PREG TEST UR: NEGATIVE

## 2015-09-01 LAB — TROPONIN I: Troponin I: <0.03 ng/mL (ref <0.031)

## 2015-09-01 LAB — D-DIMER, QUANTITATIVE: D-Dimer, Quant: <0.27 ug/mL-FEU (ref 0.00–0.50)

## 2015-09-01 MED ORDER — ASPIRIN 81 MG PO CHEW
324.0000 mg | CHEWABLE_TABLET | Freq: Once | ORAL | Status: AC
  Administered 2015-09-01: 324 mg via ORAL
  Filled 2015-09-01: qty 4

## 2015-09-01 MED ORDER — IBUPROFEN 600 MG PO TABS: 600.0000 mg | ORAL_TABLET | Freq: Three times a day (TID) | ORAL | Status: DC

## 2015-09-01 NOTE — ED Notes (Signed)
Sharp pains in stomach, light headness, stomach pains radiate to left breast

## 2015-09-01 NOTE — Discharge Instructions (Signed)
Pleurisy  Pleurisy is an inflammation and swelling of the lining of the lungs (pleura). Because of this inflammation, it hurts to breathe. It can be aggravated by coughing, laughing, or deep breathing. Pleurisy is often caused by an underlying infection or disease.   HOME CARE INSTRUCTIONS   Monitor your pleurisy for any changes. The following actions may help to alleviate any discomfort you are experiencing:  · Medicine may help with pain. Only take over-the-counter or prescription medicines for pain, discomfort, or fever as directed by your health care provider.  · Only take antibiotic medicine as directed. Make sure to finish it even if you start to feel better.  SEEK MEDICAL CARE IF:   · Your pain is not controlled with medicine or is increasing.  · You have an increase in pus-like (purulent) secretions brought up with coughing.  SEEK IMMEDIATE MEDICAL CARE IF:   · You have blue or dark lips, fingernails, or toenails.  · You are coughing up blood.  · You have increased difficulty breathing.  · You have continuing pain unrelieved by medicine or pain lasting more than 1 week.  · You have pain that radiates into your neck, arms, or jaw.  · You develop increased shortness of breath or wheezing.  · You develop a fever, rash, vomiting, fainting, or other serious symptoms.  MAKE SURE YOU:  · Understand these instructions.    · Will watch your condition.    · Will get help right away if you are not doing well or get worse.        This information is not intended to replace advice given to you by your health care provider. Make sure you discuss any questions you have with your health care provider.     Document Released: 09/10/2005 Document Revised: 05/13/2013 Document Reviewed: 02/22/2013  Elsevier Interactive Patient Education ©2016 [employer]

## 2015-09-01 NOTE — ED Notes (Signed)
Denies any chest or abdominal pain at this time

## 2015-09-01 NOTE — ED Provider Notes (Signed)
CSN: [id number]     Arrival date & time 09/01/15  0802 History   First MD Initiated Contact with Patient 09/01/15 [phone]     Chief Complaint  Patient presents with  . Headache     (Consider location/radiation/quality/duration/timing/severity/associated sxs/prior Treatment) HPI Patient presents with left-sided chest tightness that started yesterday evening around 9 PM. Patient states she's had symptoms through the night. She states the symptoms are worse with deep breathing. The pain radiates to her left shoulder. She denies any current shortness of breath, cough, fever or chills. No URI symptoms. No lower extremity swelling or pain. Patient admits to lightheadedness. She also has left upper quadrant abdominal pain that she describes as "sharp" and "shooting" that radiates into the left chest. Patient has no recent extended travel. She is no longer on birth control. She states she has several distant relatives who have had MI's. No family history of thrombo-embolic disorder. History reviewed. No pertinent past medical history. Past Surgical History  Procedure Laterality Date  . Tonsillectomy    . Hernia repair     History reviewed. No pertinent family history. Social History  Substance Use Topics  . Smoking status: Current Every Day Smoker -- 0.50 packs/day    Types: Cigarettes  . Smokeless tobacco: Never Used  . Alcohol Use: 2.0 oz/week    4 drink(s) per week   OB History    No data available     Review of Systems  Constitutional: Negative for fever and chills.  HENT: Negative for congestion, sinus pressure and sore throat.   Respiratory: Positive for shortness of breath. Negative for cough.   Cardiovascular: Positive for chest pain. Negative for palpitations and leg swelling.  Gastrointestinal: Positive for abdominal pain. Negative for nausea, vomiting, diarrhea and constipation.  Musculoskeletal: Positive for arthralgias. Negative for myalgias, back pain, neck pain and neck  stiffness.  Skin: Negative for rash and wound.  Neurological: Positive for dizziness and light-headedness. Negative for weakness, numbness and headaches.  All other systems reviewed and are negative.     Allergies  Review of patient's allergies indicates no known allergies.  Home Medications   Prior to Admission medications   Medication Sig Start Date End Date Taking? Authorizing Provider  albuterol (PROVENTIL HFA;VENTOLIN HFA) 108 (90 BASE) MCG/ACT inhaler Inhale 2 puffs into the lungs every 4 (four) hours as needed for wheezing or shortness of breath. 11/15/13   Shon Batonourtney F Horton, MD  azithromycin (ZITHROMAX) 250 MG tablet Take 1 tablet (250 mg total) by mouth daily. Take first 2 tablets together, then 1 every day until finished. 11/15/13   Shon Batonourtney F Horton, MD  ibuprofen (ADVIL,MOTRIN) 600 MG tablet Take 1 tablet (600 mg total) by mouth 3 (three) times daily after meals. 09/01/15   Loren Raceravid Gaspare Netzel, MD  predniSONE (DELTASONE) 50 MG tablet Take 1 tablet (50 mg total) by mouth daily. 11/15/13   Shon Batonourtney F Horton, MD   BP 123/85 mmHg  Pulse 68  Temp(Src) 98.1 F (36.7 C) (Oral)  Resp 16  Ht 5\' 6"  (1.676 m)  Wt 217 lb (98.431 kg)  BMI 35.04 kg/m2  SpO2 100%  LMP 05/02/2015 (Approximate) Physical Exam  Constitutional: She is oriented to person, place, and time. She appears well-developed and well-nourished. No distress.  HENT:  Head: Normocephalic and atraumatic.  Mouth/Throat: Oropharynx is clear and moist. No oropharyngeal exudate.  Eyes: EOM are normal. Pupils are equal, round, and reactive to light.  Neck: Normal range of motion. Neck supple. No JVD present.  Cardiovascular:  Normal rate and regular rhythm.  Exam reveals no gallop and no friction rub.   No murmur heard. Pulmonary/Chest: Effort normal and breath sounds normal. No respiratory distress. She has no wheezes. She has no rales. She exhibits no tenderness.  Abdominal: Soft. Bowel sounds are normal. She exhibits no  distension and no mass. There is no tenderness. There is no rebound and no guarding.  Musculoskeletal: Normal range of motion. She exhibits no edema or tenderness.  No CVA tenderness. No lower extremity swelling or pain.  Neurological: She is alert and oriented to person, place, and time.  Moves all extremities without deficit. Sensation is fully intact.  Skin: Skin is warm and dry. No rash noted. No erythema.  Psychiatric: She has a normal mood and affect. Her behavior is normal.  Nursing note and vitals reviewed.   ED Course  Procedures (including critical care time) Labs Review Labs Reviewed  CBC WITH DIFFERENTIAL/PLATELET - Abnormal; Notable for the following:    WBC 3.5 (*)    All other components within normal limits  COMPREHENSIVE METABOLIC PANEL - Abnormal; Notable for the following:    Glucose, Bld 100 (*)    Alkaline Phosphatase 37 (*)    All other components within normal limits  LIPASE, BLOOD  TROPONIN I  PREGNANCY, URINE  URINALYSIS, ROUTINE W REFLEX MICROSCOPIC (NOT AT [facility])  D-DIMER, QUANTITATIVE (NOT AT [facility])    Imaging Review Dg Chest 2 View  09/01/2015  CLINICAL DATA:  Shortness of Breath EXAM: CHEST  2 VIEW COMPARISON:  November 15, 2013 FINDINGS: Lungs are clear. Heart size and pulmonary vascularity are normal. No adenopathy. No bone lesions. IMPRESSION: No edema or consolidation. Electronically Signed   By: Bretta Bang III M.D.   On: 09/01/2015 08:54   I have personally reviewed and evaluated these images and lab results as part of my medical decision-making.   EKG Interpretation   Date/Time:  Thursday September 01 2015 08:07:47 EST Ventricular Rate:  81 PR Interval:  144 QRS Duration: 82 QT Interval:  360 QTC Calculation: 418 R Axis:   58 Text Interpretation:  Normal sinus rhythm Normal ECG Confirmed by  Ranae Palms  MD, Tanis Hensarling ([id number]) on 09/01/2015 8:27:49 AM      MDM   Final diagnoses:  Pleuritic chest pain   Chest pain is improved.  Patient's vital signs remained stable. EKG, troponin, d-dimer are all normal. Patient's symptoms have been persistent for 24 hours. Do not believe delta troponin is necessary to rule out acute myocardial infarctions. Suspect possible pleurisy as the cause of the patient's pain. She understands the need to return for any worsening or concerns. Will start on anti-inflammatory.  Loren Racer, MD 09/01/15 [phone]

## 2015-09-01 NOTE — ED Notes (Signed)
MD at bedside. 

## 2016-02-07 ENCOUNTER — Emergency Department (HOSPITAL_BASED_OUTPATIENT_CLINIC_OR_DEPARTMENT_OTHER)
Admission: EM | Admit: 2016-02-07 | Discharge: 2016-02-07 | Disposition: A | Discharge status: Home / Self Care | Payer: Medicaid Other | Attending: Emergency Medicine | Admitting: Emergency Medicine

## 2016-02-07 ENCOUNTER — Encounter (HOSPITAL_BASED_OUTPATIENT_CLINIC_OR_DEPARTMENT_OTHER): Payer: Self-pay

## 2016-02-07 DIAGNOSIS — F17210 Nicotine dependence, cigarettes, uncomplicated: Secondary | ICD-10-CM | POA: Insufficient documentation

## 2016-02-07 DIAGNOSIS — K120 Recurrent oral aphthae: Secondary | ICD-10-CM | POA: Insufficient documentation

## 2016-02-07 LAB — PREGNANCY, URINE: Preg Test, Ur: NEGATIVE

## 2016-02-07 NOTE — Discharge Instructions (Signed)
Oral Ulcers °Oral ulcers are painful, shallow sores around the lining of the mouth. They can affect the gums, the inside of the lips, and the cheeks. (Sores on the outside of the lips and on the face are different.) They typically first occur in school-aged children and teenagers. Oral ulcers may also be called canker sores or cold sores. °CAUSES  °Canker sores and cold sores can be caused by many factors including: °· Infection. °· Injury. °· Sun exposure. °· Medications. °· Emotional stress. °· Food allergies. °· Vitamin deficiencies. °· Toothpastes containing sodium lauryl sulfate. °The herpes virus can be the cause of mouth ulcers. The first infection can be severe and cause 10 or more ulcers on the gums, tongue, and lips with fever and difficulty in swallowing. This infection usually occurs between the ages of 1 and 3 years.  °SYMPTOMS  °The typical sore is about ¼ inch (6 mm) in size and is an oval or round ulcer with red borders. °DIAGNOSIS  °Your caregiver can diagnose simple oral ulcers by examination. Additional testing is usually not required.  °TREATMENT  °Treatment is aimed at pain relief. Generally, oral ulcers resolve by themselves within 1 to 2 weeks without medication and are not contagious unless caused by herpes (and other viruses). Antibiotics are not effective with mouth sores. Avoid direct contact with others until the ulcer is completely healed. See your caregiver for follow-up care as recommended. Also: °· Offer a soft diet. °· Encourage plenty of fluids to prevent dehydration. Popsicles and milk shakes can be helpful. °· Avoid acidic and salty foods and drinks such as orange juice. °· Infants and young children will often refuse to drink because of pain. Using a teaspoon, cup, or syringe to give small amounts of fluids frequently can help prevent dehydration. °· Cold compresses on the face may help reduce pain. °· Pain medication can help control soreness. °· A solution of diphenhydramine  mixed with a liquid antacid can be useful to decrease the soreness of ulcers. Consult a caregiver for the dosing. °· Liquids or ointments with a numbing ingredient may be helpful when used as recommended. °· Older children and teenagers can rinse their mouth with a salt-water mixture (1/2 teaspoon of salt in 8 ounces of water) four times a day. This treatment is uncomfortable but may reduce the time the ulcers are present. °· There are many over-the-counter throat lozenges and medications available for oral ulcers. Their effectiveness has not been studied. °· Consult your medical caregiver prior to using homeopathic treatments for oral ulcers. °SEEK MEDICAL CARE IF:  °· You think your child needs to be seen. °· The pain worsens and you cannot control it. °· There are 4 or more ulcers. °· The lips and gums begin to bleed and crust. °· A single mouth ulcer is near a tooth that is causing a toothache or pain. °· Your child has a fever, swollen face, or swollen glands. °· The ulcers began after starting a medication. °· Mouth ulcers keep reoccurring or last more than 2 weeks. °· You think your child is not taking adequate fluids. °SEEK IMMEDIATE MEDICAL CARE IF:  °· Your child has a high fever. °· Your child is unable to swallow or becomes dehydrated. °· Your child looks or acts very ill. °· An ulcer caused by a chemical your child accidentally put in their mouth. °  °This information is not intended to replace advice given to you by your health care provider. Make sure you discuss any   questions you have with your health care provider. °  °Document Released: 10/18/2004 Document Revised: 10/01/2014 Document Reviewed: 01/26/2015 °Elsevier Interactive Patient Education ©2016 [employer]. ° °

## 2016-02-07 NOTE — ED Provider Notes (Addendum)
CSN: [id number]     Arrival date & time 02/07/16  1449 History   First MD Initiated Contact with Patient 02/07/16 1457     Chief Complaint  Patient presents with  . Mouth Lesions     (Consider location/radiation/quality/duration/timing/severity/associated sxs/prior Treatment) Patient is a 30 y.o. female presenting with mouth sores. The history is provided by the patient.  Mouth Lesions Location:  Tongue Quality:  Painful and ulcerous Pain details:    Quality:  Aching   Severity:  Mild   Duration:  2 days   Timing:  Constant   Progression:  Worsening Onset quality:  Gradual Severity:  Moderate Progression:  Worsening Chronicity:  New Context: possible infection   Context: not a change in diet, not a change in medications, not stress and not trauma   Relieved by:  Nothing Worsened by:  Nothing tried Ineffective treatments:  None tried Associated symptoms: no congestion, no dental pain, no ear pain, no fever, no rhinorrhea and no sore throat     History reviewed. No pertinent past medical history. Past Surgical History  Procedure Laterality Date  . Tonsillectomy    . Hernia repair     No family history on file. Social History  Substance Use Topics  . Smoking status: Current Every Day Smoker -- 0.50 packs/day    Types: Cigarettes  . Smokeless tobacco: Never Used  . Alcohol Use: No   OB History    No data available     Review of Systems  Constitutional: Negative for fever.  HENT: Positive for mouth sores. Negative for congestion, ear pain, rhinorrhea and sore throat.   All other systems reviewed and are negative.     Allergies  Review of patient's allergies indicates no known allergies.  Home Medications   Prior to Admission medications   Not on File   BP 120/73 mmHg  Pulse 107  Temp(Src) 98.8 F (37.1 C) (Oral)  Resp 18  Ht 5\' 5"  (1.651 m)  Wt 220 lb (99.791 kg)  BMI 36.61 kg/m2  SpO2 99% Physical Exam  Constitutional: She is oriented to person,  place, and time. She appears well-developed and well-nourished. No distress.  HENT:  Head: Normocephalic and atraumatic.  Mouth/Throat: Oral lesions present. No oropharyngeal exudate, posterior oropharyngeal edema or posterior oropharyngeal erythema.    Eyes: EOM are normal. Pupils are equal, round, and reactive to light.  Cardiovascular: Normal rate.   Pulmonary/Chest: Effort normal.  Lymphadenopathy:    She has cervical adenopathy.  Neurological: She is alert and oriented to person, place, and time.  Skin: Skin is warm and dry. No rash noted. No erythema.  Psychiatric: She has a normal mood and affect. Her behavior is normal.  Nursing note and vitals reviewed.   ED Course  Procedures (including critical care time) Labs Review Labs Reviewed  PREGNANCY, URINE    Imaging Review No results found. I have personally reviewed and evaluated these images and lab results as part of my medical decision-making.   EKG Interpretation None      MDM   Final diagnoses:  Aphthous ulcer of mouth    Patient presenting with an apthous ulcer today and mild left anterior chain cervical adenopathy.  No respiratory complaints and swallowing without difficulty and no sore throat. Most likely viral in nature. No evidence of hand-foot-and-mouth.  Recommended that pt use ibuprofen and tylenol. Pt also request a UPT due to not having a period since April of last year and no longer on depo.  UPT  neg.  Gwyneth Sprout, MD 02/07/16 1610  Gwyneth Sprout, MD 02/07/16 [phone]

## 2016-02-07 NOTE — ED Notes (Signed)
C/o "bumps on tongue" yesterday and swollen lymph nodes today-NAD-steady gait

## 2016-06-09 ENCOUNTER — Emergency Department (HOSPITAL_BASED_OUTPATIENT_CLINIC_OR_DEPARTMENT_OTHER): Payer: Medicaid Other

## 2016-06-09 ENCOUNTER — Encounter (HOSPITAL_BASED_OUTPATIENT_CLINIC_OR_DEPARTMENT_OTHER): Payer: Self-pay | Admitting: Emergency Medicine

## 2016-06-09 ENCOUNTER — Emergency Department (HOSPITAL_BASED_OUTPATIENT_CLINIC_OR_DEPARTMENT_OTHER)
Admission: EM | Admit: 2016-06-09 | Discharge: 2016-06-09 | Disposition: A | Discharge status: Home / Self Care | Payer: Medicaid Other | Attending: Emergency Medicine | Admitting: Emergency Medicine

## 2016-06-09 DIAGNOSIS — F17210 Nicotine dependence, cigarettes, uncomplicated: Secondary | ICD-10-CM | POA: Insufficient documentation

## 2016-06-09 DIAGNOSIS — M79605 Pain in left leg: Secondary | ICD-10-CM

## 2016-06-09 LAB — PREGNANCY, URINE: Preg Test, Ur: NEGATIVE

## 2016-06-09 MED ORDER — METHOCARBAMOL 500 MG PO TABS: 500.0000 mg | ORAL_TABLET | Freq: Two times a day (BID) | ORAL | 0 refills | Status: DC

## 2016-06-09 MED ORDER — NAPROXEN 250 MG PO TABS
500.0000 mg | ORAL_TABLET | Freq: Once | ORAL | Status: AC
  Administered 2016-06-09: 500 mg via ORAL
  Filled 2016-06-09: qty 2

## 2016-06-09 MED ORDER — NAPROXEN 500 MG PO TABS: 500.0000 mg | ORAL_TABLET | Freq: Two times a day (BID) | ORAL | 0 refills | Status: DC

## 2016-06-09 MED ORDER — HYDROCODONE-ACETAMINOPHEN 5-325 MG PO TABS: 1.0000 | ORAL_TABLET | ORAL | 0 refills | Status: DC | PRN

## 2016-06-09 NOTE — Discharge Instructions (Signed)
Your x-ray was normal. Your ultrasound showed no evidence of a blood clot. Your pregnancy test was negative. Take medication as prescribed as needed for pain. Call Dr. Lazaro ArmsHudnall's office to schedule orthopedic follow up.

## 2016-06-09 NOTE — ED Notes (Signed)
Left leg pain x 3 days. Pt does a lot of walking at work.

## 2016-06-09 NOTE — ED Provider Notes (Signed)
[facility] Note   CSN: [id number] Arrival date & time: 06/09/16  1838  By signing my name below, I, Emmanuella Mensah, attest that this documentation has been prepared under the direction and in the presence of Donnie Gedeon, [place]. Electronically Signed: Angelene Giovanni, ED Scribe. 06/09/16. 7:21 PM.    History   Chief Complaint Chief Complaint  Patient presents with  . Leg Pain   HPI Comments: Virginia Wagner is a 30 y.o. female who presents to the Emergency Department complaining of gradually worsening moderate left posterior knee pain onset 3 days ago. No alleviating factors noted. Pt has tried Tylenol and BC with temporary relief. 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 She adds that she has concerns for a DVT and also the last time she had these symptoms, she found out she was pregnant with her son. She denies any recent long car/plane trips, immobilizations, or birth control use. Pt requests a pregnancy test here. She denies any fever, chills, chest pain, leg swelling, generalized rash, or any open wounds.   The history is provided by the patient. No language interpreter was used.    History reviewed. No pertinent past medical history.  There are no active problems to display for this patient.   Past Surgical History:  Procedure Laterality Date  . HERNIA REPAIR    . TONSILLECTOMY      OB History    No data available       Home Medications    Prior to Admission medications   Not on File    Family History No family history on file.  Social History Social History  Substance Use Topics  . Smoking status: Current Every Day Smoker    Packs/day: 0.50    Types: Cigarettes  . Smokeless tobacco: Never Used  . Alcohol use Yes     Comment: rare     Allergies   Review of patient's allergies indicates no known allergies.   Review of  Systems Review of Systems  Constitutional: Negative for chills and fever.  Cardiovascular: Negative for chest pain and leg swelling.  Musculoskeletal: Positive for arthralgias.  Skin: Negative for rash and wound.  All other systems reviewed and are negative.    Physical Exam Updated Vital Signs BP 144/95 (BP Location: Left Arm)   Pulse 72   Temp 97.9 F (36.6 C) (Oral)   Resp 18   Ht 5\' 5"  (1.651 m)   Wt 228 lb (103.4 kg)   LMP  (LMP Unknown) Comment: pt had been on depo, was taken off of it "months" ago.  SpO2 99%   BMI 37.94 kg/m   Physical Exam  Constitutional: She is oriented to person, place, and time. She appears well-developed and well-nourished. No distress.  HENT:  Head: Normocephalic and atraumatic.  Eyes: Conjunctivae and EOM are normal.  Neck: Neck supple. No tracheal deviation present.  Cardiovascular: Normal rate.   Pulmonary/Chest: Effort normal. No respiratory distress.  Musculoskeletal: Normal range of motion.  Mild tenderness left popliteal fossa and anterior knee tenderness. FROM of left knee. No calf tenderness or swelling. Legs are of equal size throughout bilaterally. 2+ DP and PT. Sensation intact. Steady gait.  Neurological: She is alert and oriented to person, place, and time.  Skin: Skin is warm and dry.  Psychiatric: She has a normal mood and affect. Her behavior is normal.  Nursing note  and vitals reviewed.    ED Treatments / Results  DIAGNOSTIC STUDIES: Oxygen Saturation is 99% on RA, normal by my interpretation.    COORDINATION OF CARE: 7:19 PM- Pt advised of plan for treatment and pt agrees. She will receive left leg x-ray and pregnancy test for further evaluation. She will also receive Naproxen.    Labs (all labs ordered are listed, but only abnormal results are displayed) Labs Reviewed  PREGNANCY, URINE    Radiology US Venous Img Lower Unilateral Left  Result Date: 06/09/2016 CLINICAL DATA:  Left knee pain posteriorly radiating  throughout entire leg for 3 days. Worse with ambulation. EXAM: LEFT LOWER EXTREMITY VENOUS DOPPLER ULTRASOUND TECHNIQUE: Gray-scale sonography with graded compression, as well as color Doppler and duplex ultrasound were performed to evaluate the lower extremity deep venous systems from the level of the common femoral vein and including the common femoral, femoral, profunda femoral, popliteal and calf veins including the posterior tibial, peroneal and gastrocnemius veins when visible. The superficial great saphenous vein was also interrogated. Spectral Doppler was utilized to evaluate flow at rest and with distal augmentation maneuvers in the common femoral, femoral and popliteal veins. COMPARISON:  None. FINDINGS: Contralateral Common Femoral Vein: Respiratory phasicity is normal and symmetric with the symptomatic side. No evidence of thrombus. Normal compressibility. Common Femoral Vein: No evidence of thrombus. Normal compressibility, respiratory phasicity and response to augmentation. Saphenofemoral Junction: No evidence of thrombus. Normal compressibility and flow on color Doppler imaging. Profunda Femoral Vein: No evidence of thrombus. Normal compressibility and flow on color Doppler imaging. Femoral Vein: No evidence of thrombus. Normal compressibility, respiratory phasicity and response to augmentation. Popliteal Vein: No evidence of thrombus. Normal compressibility, respiratory phasicity and response to augmentation. Calf Veins: No evidence of thrombus. Normal compressibility and flow on color Doppler imaging. Superficial Great Saphenous Vein: No evidence of thrombus. Normal compressibility and flow on color Doppler imaging. Venous Reflux:  None. Other Findings:  None. IMPRESSION: No evidence of deep venous thrombosis. Electronically Signed   By: Elberta Fortis M.D.   On: 06/09/2016 20:03   Dg Knee Complete 4 Views Left  Result Date: 06/09/2016 CLINICAL DATA:  Left knee pain for 2 days.  No reported  injury. EXAM: LEFT KNEE - COMPLETE 4+ VIEW COMPARISON:  None. FINDINGS: No evidence of fracture, dislocation, or joint effusion. No evidence of arthropathy or other focal bone abnormality. Soft tissues are unremarkable. IMPRESSION: Negative. Electronically Signed   By: Delbert Phenix M.D.   On: 06/09/2016 20:11    Procedures Procedures (including critical care time)  Medications Ordered in ED Medications  naproxen (NAPROSYN) tablet 500 mg (500 mg Oral Given 06/09/16 2017)     Initial Impression / Assessment and Plan / ED Course  Noelle Penner, PA-C has reviewed the triage vital signs and the nursing notes.  Pertinent labs & imaging results that were available during my care of the patient were reviewed by me and considered in my medical decision making (see chart for details).  Clinical Course    XR negative. DVT study negative. Pregnancy test negative. 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 Encouraged ortho f/u. ER return precautions given.  Final Clinical Impressions(s) / ED Diagnoses   Final diagnoses:  Left leg pain    New Prescriptions Discharge Medication List as of 06/09/2016  9:32 PM    START taking these medications   Details  HYDROcodone-acetaminophen (NORCO/VICODIN) 5-325 MG tablet Take 1 tablet  by mouth every 4 (four) hours as needed for severe pain., Starting Sat 06/09/2016, Print    methocarbamol (ROBAXIN) 500 MG tablet Take 1 tablet (500 mg total) by mouth 2 (two) times daily., Starting Sat 06/09/2016, Print    naproxen (NAPROSYN) 500 MG tablet Take 1 tablet (500 mg total) by mouth 2 (two) times daily., Starting Sat 06/09/2016, Print         Carlene CoriaSerena Y Rasean Joos, PA-C 06/10/16 1727    Doug SouSam Jacubowitz, MD 06/11/16 [phone]

## 2016-06-09 NOTE — ED Triage Notes (Signed)
Pt c/o left leg pain for 3 days.  Started in area of posterior knee and now pain has spread to upper left leg.  Somewhat relieved by rest and elevation.  Pt took 2 tylenol 0630 and bc goodys at approx 1300 today with no reilef.

## 2016-10-04 ENCOUNTER — Emergency Department (HOSPITAL_BASED_OUTPATIENT_CLINIC_OR_DEPARTMENT_OTHER): Payer: Medicaid Other

## 2016-10-04 ENCOUNTER — Emergency Department (HOSPITAL_BASED_OUTPATIENT_CLINIC_OR_DEPARTMENT_OTHER)
Admission: EM | Admit: 2016-10-04 | Discharge: 2016-10-04 | Disposition: A | Discharge status: Home / Self Care | Payer: Medicaid Other | Attending: Emergency Medicine | Admitting: Emergency Medicine

## 2016-10-04 ENCOUNTER — Encounter (HOSPITAL_BASED_OUTPATIENT_CLINIC_OR_DEPARTMENT_OTHER): Payer: Self-pay | Admitting: *Deleted

## 2016-10-04 DIAGNOSIS — F17210 Nicotine dependence, cigarettes, uncomplicated: Secondary | ICD-10-CM | POA: Insufficient documentation

## 2016-10-04 DIAGNOSIS — R0602 Shortness of breath: Secondary | ICD-10-CM | POA: Insufficient documentation

## 2016-10-04 DIAGNOSIS — R05 Cough: Secondary | ICD-10-CM | POA: Insufficient documentation

## 2016-10-04 DIAGNOSIS — R059 Cough, unspecified: Secondary | ICD-10-CM

## 2016-10-04 DIAGNOSIS — R0789 Other chest pain: Secondary | ICD-10-CM | POA: Insufficient documentation

## 2016-10-04 DIAGNOSIS — J029 Acute pharyngitis, unspecified: Secondary | ICD-10-CM | POA: Insufficient documentation

## 2016-10-04 MED ORDER — BENZONATATE 100 MG PO CAPS
100.0000 mg | ORAL_CAPSULE | Freq: Once | ORAL | Status: AC
  Administered 2016-10-04: 100 mg via ORAL
  Filled 2016-10-04: qty 1

## 2016-10-04 MED ORDER — PREDNISONE 20 MG PO TABS: 40.0000 mg | ORAL_TABLET | Freq: Every day | ORAL | 0 refills | Status: DC

## 2016-10-04 MED ORDER — IPRATROPIUM-ALBUTEROL 0.5-2.5 (3) MG/3ML IN SOLN
3.0000 mL | Freq: Four times a day (QID) | RESPIRATORY_TRACT | Status: DC
  Administered 2016-10-04: 3 mL via RESPIRATORY_TRACT
  Filled 2016-10-04: qty 3

## 2016-10-04 MED ORDER — PREDNISONE 50 MG PO TABS
60.0000 mg | ORAL_TABLET | Freq: Once | ORAL | Status: AC
  Administered 2016-10-04: 60 mg via ORAL
  Filled 2016-10-04: qty 1

## 2016-10-04 MED ORDER — ALBUTEROL SULFATE HFA 108 (90 BASE) MCG/ACT IN AERS: 1.0000 | INHALATION_SPRAY | Freq: Four times a day (QID) | RESPIRATORY_TRACT | 0 refills | Status: AC | PRN

## 2016-10-04 MED ORDER — BENZONATATE 100 MG PO CAPS: 100.0000 mg | ORAL_CAPSULE | Freq: Three times a day (TID) | ORAL | 0 refills | Status: DC

## 2016-10-04 NOTE — ED Triage Notes (Signed)
Cough and SOB. She is in no distress on arrival. Speaking in complete sentences.

## 2016-10-04 NOTE — Discharge Instructions (Signed)
X-ray was normal. This is likely a viral bronchitis. Use the albuterol as needed. Take the prednisone as prescribed for the next 4 days. Take Tessalon as needed. Use Mucinex over-the-counter to help with chest congestion and cough. Follow-up with primary care doctor. Return to ED if you develop any exertional chest pain, worsening shortness of breath, fevers, worsening sputum production or for any reason.

## 2016-10-04 NOTE — ED Provider Notes (Signed)
[facility]   CSN: [id number] Arrival date & time: 10/04/16  1819  By signing my name below, I, [name], attest that this documentation has been prepared under the direction and in the presence of Demetrios Loll, PA-C. Electronically Signed: [name], Scribe. 10/04/2016. 7:55 PM.  History   Chief Complaint Chief Complaint  Patient presents with  . Cough    The history is provided by the patient. No language interpreter was used.     HPI Comments: Virginia Wagner is a 31 y.o. female who presents to the Emergency Department complaining of a persistent non-productive cough beginning around noon today. She notes associated chest congestion, SOB secondary to her cough, generalized myalgias, and a gradually worsening sore throat. She states she can hear rattling sensations in her chest when she breathes. She tried Mucinex PTA with no significant improvement of her symptoms. She notes a h/o bronchitis and states her current symptoms feel very similar. No known sick contacts with similar symptoms. No h/o blood clot or heart problems. No recent immobilizations. No hormone therapy. She denies ear pain, sinus pressure, fever, chills, wheezing, rhinorrhea, or any other associated symptoms.  History reviewed. No pertinent past medical history.  There are no active problems to display for this patient.   Past Surgical History:  Procedure Laterality Date  . HERNIA REPAIR    . TONSILLECTOMY      OB History    No data available       Home Medications    Prior to Admission medications   Medication Sig Start Date End Date Taking? Authorizing Provider  HYDROcodone-acetaminophen (NORCO/VICODIN) 5-325 MG tablet Take 1 tablet by mouth every 4 (four) hours as needed for severe pain. 06/09/16   Ace Gins Sam, PA-C  methocarbamol (ROBAXIN) 500 MG tablet Take 1 tablet (500 mg total) by mouth 2 (two) times daily. 06/09/16   Ace Gins Sam, PA-C  naproxen  (NAPROSYN) 500 MG tablet Take 1 tablet (500 mg total) by mouth 2 (two) times daily. 06/09/16   Carlene Coria, PA-C    Family History No family history on file.  Social History Social History  Substance Use Topics  . Smoking status: Current Every Day Smoker    Packs/day: 0.50    Types: Cigarettes  . Smokeless tobacco: Never Used  . Alcohol use Yes     Comment: rare     Allergies   Patient has no known allergies.   Review of Systems Review of Systems  Constitutional: Negative for chills and fever.  HENT: Positive for sore throat. Negative for ear pain, rhinorrhea and sinus pressure.   Respiratory: Positive for cough and shortness of breath. Negative for wheezing.   Musculoskeletal: Positive for myalgias (generalized).  All other systems reviewed and are negative.    Physical Exam Updated Vital Signs BP 135/99   Pulse 87   Temp 98.2 F (36.8 C) (Oral)   Resp 20   Ht 5\' 5"  (1.651 m)   Wt 228 lb (103.4 kg)   SpO2 100%   BMI 37.94 kg/m   Physical Exam  Constitutional: She is oriented to person, place, and time. She appears well-developed and well-nourished. No distress.  HENT:  Head: Normocephalic and atraumatic.  Right Ear: Tympanic membrane, external ear and ear canal normal.  Left Ear: Tympanic membrane, external ear and ear canal normal.  Nose: Nose normal.  Mouth/Throat: Uvula is midline, oropharynx is clear and moist and mucous membranes are normal.  Eyes: Conjunctivae and EOM  are normal. Pupils are equal, round, and reactive to light.  Neck: Normal range of motion. Neck supple. No tracheal deviation present.  Cardiovascular: Normal rate, regular rhythm, normal heart sounds and intact distal pulses.   Pulmonary/Chest: Effort normal and breath sounds normal. No respiratory distress. She has no wheezes. She exhibits no tenderness.  Musculoskeletal: Normal range of motion.  Lymphadenopathy:    She has no cervical adenopathy.  Neurological: She is alert and  oriented to person, place, and time.  Skin: Skin is warm and dry. Capillary refill takes less than 2 seconds.  Psychiatric: She has a normal mood and affect. Her behavior is normal.  Nursing note and vitals reviewed.    ED Treatments / Results  Labs (all labs ordered are listed, but only abnormal results are displayed) Labs Reviewed - No data to display  EKG  EKG Interpretation None       Radiology Dg Chest 2 View  Result Date: 10/04/2016 CLINICAL DATA:  Cough EXAM: CHEST  2 VIEW COMPARISON:  09/01/2015 FINDINGS: No acute infiltrate or effusion. Borderline cardiomegaly. Normal vascularity. No pneumothorax. IMPRESSION: No active cardiopulmonary disease. Electronically Signed   By: Jasmine PangKim  Fujinaga M.D.   On: 10/04/2016 19:55    Procedures Procedures (including critical care time)  DIAGNOSTIC STUDIES: Oxygen Saturation is 100% on RA, normal by my interpretation.    COORDINATION OF CARE: 8:01 PM Discussed treatment plan with pt at bedside and pt agreed to plan.  Medications Ordered in ED Medications  ipratropium-albuterol (DUONEB) 0.5-2.5 (3) MG/3ML nebulizer solution 3 mL (not administered)  predniSONE (DELTASONE) tablet 60 mg (not administered)  benzonatate (TESSALON) capsule 100 mg (not administered)     Initial Impression / Assessment and Plan / ED Course  I have reviewed the triage vital signs and the nursing notes.  Pertinent labs & imaging results that were available during my care of the patient were reviewed by me and considered in my medical decision making (see chart for details).  Clinical Course   Patient presents to the ED with nonproductive cough, wheezing, mild shortness of breath, chest discomfort secondary to cough. Symptoms started this morning. Patient history of bronchitis and states this feels similar. She is perk negative. She is not hypoxic or tachypneic. Low suspicion for PE. Doubt ACS. Chest x-ray shows no signs of focal infiltrate. Patient was  given DuoNeb in the ED with significant improvement in her symptoms. This is likely a viral bronchitis. She was treated with prednisone, albuterol inhaler, cough medicine. Encouraged patient to continue symptomatic over-the-counter treatment medications. Patient feels improved and stable for discharge. Pt is hemodynamically stable, in NAD, & able to ambulate in the ED. Pain has been managed & has no complaints prior to dc. Pt is comfortable with above plan and is stable for discharge at this time. All questions were answered prior to disposition. Strict return precautions for f/u to the ED were discussed.   Final Clinical Impressions(s) / ED Diagnoses   Final diagnoses:  Cough    New Prescriptions Discharge Medication List as of 10/04/2016  8:38 PM    START taking these medications   Details  albuterol (PROVENTIL HFA;VENTOLIN HFA) 108 (90 Base) MCG/ACT inhaler Inhale 1-2 puffs into the lungs every 6 (six) hours as needed for wheezing or shortness of breath., Starting Thu 10/04/2016, Print    benzonatate (TESSALON) 100 MG capsule Take 1 capsule (100 mg total) by mouth every 8 (eight) hours., Starting Thu 10/04/2016, Print    predniSONE (DELTASONE) 20 MG tablet  Take 2 tablets (40 mg total) by mouth daily with breakfast., Starting Thu 10/04/2016, Print       I personally performed the services described in this documentation, which was scribed in my presence. The recorded information has been reviewed and is accurate.    Rise Mu, PA-C 10/05/16 0128    Jacalyn Lefevre, MD 10/09/16 [phone]

## 2017-01-24 ENCOUNTER — Encounter (HOSPITAL_BASED_OUTPATIENT_CLINIC_OR_DEPARTMENT_OTHER): Payer: Self-pay | Admitting: *Deleted

## 2017-01-24 ENCOUNTER — Emergency Department (HOSPITAL_BASED_OUTPATIENT_CLINIC_OR_DEPARTMENT_OTHER)
Admission: EM | Admit: 2017-01-24 | Discharge: 2017-01-24 | Disposition: A | Discharge status: Home / Self Care | Payer: No Typology Code available for payment source | Attending: Emergency Medicine | Admitting: Emergency Medicine

## 2017-01-24 DIAGNOSIS — Z79899 Other long term (current) drug therapy: Secondary | ICD-10-CM | POA: Insufficient documentation

## 2017-01-24 DIAGNOSIS — F17210 Nicotine dependence, cigarettes, uncomplicated: Secondary | ICD-10-CM | POA: Diagnosis not present

## 2017-01-24 DIAGNOSIS — R5383 Other fatigue: Secondary | ICD-10-CM | POA: Diagnosis present

## 2017-01-24 DIAGNOSIS — J069 Acute upper respiratory infection, unspecified: Secondary | ICD-10-CM | POA: Insufficient documentation

## 2017-01-24 LAB — URINALYSIS, ROUTINE W REFLEX MICROSCOPIC
BILIRUBIN URINE: NEGATIVE
Glucose, UA: NEGATIVE mg/dL
HGB URINE DIPSTICK: NEGATIVE
Ketones, ur: NEGATIVE mg/dL
Leukocytes, UA: NEGATIVE
Nitrite: NEGATIVE
PH: 6 (ref 5.0–8.0)
Protein, ur: NEGATIVE mg/dL
SPECIFIC GRAVITY, URINE: 1.026 (ref 1.005–1.030)

## 2017-01-24 LAB — RAPID STREP SCREEN (MED CTR MEBANE ONLY): Streptococcus, Group A Screen (Direct): NEGATIVE

## 2017-01-24 LAB — PREGNANCY, URINE: Preg Test, Ur: NEGATIVE

## 2017-01-24 MED ORDER — OXYMETAZOLINE HCL 0.05 % NA SOLN
1.0000 | Freq: Once | NASAL | Status: AC
  Administered 2017-01-24: 1 via NASAL
  Filled 2017-01-24: qty 15

## 2017-01-24 MED ORDER — ACETAMINOPHEN 500 MG PO TABS
1000.0000 mg | ORAL_TABLET | Freq: Once | ORAL | Status: AC
  Administered 2017-01-24: 1000 mg via ORAL
  Filled 2017-01-24: qty 2

## 2017-01-24 MED ORDER — IBUPROFEN 600 MG PO TABS: 600.0000 mg | ORAL_TABLET | Freq: Four times a day (QID) | ORAL | 0 refills | Status: DC | PRN

## 2017-01-24 MED ORDER — LORATADINE 10 MG PO TABS: 10.0000 mg | ORAL_TABLET | Freq: Every day | ORAL | 0 refills | Status: DC

## 2017-01-24 NOTE — ED Triage Notes (Signed)
Since yesterday she has had pressure in her left ear, swollen glands in her neck today and occasional chest gas like pain. She is ambulatory in no distress. She feels tired.

## 2017-01-24 NOTE — ED Provider Notes (Signed)
[facility] Provider Note   CSN: [id number] Arrival date & time: 01/24/17  1055     History   Chief Complaint Chief Complaint  Patient presents with  . Fatigue    HPI Clearance Virginia Wagner is a 31 y.o. female.  Pt presents to the ED today with fatigue.  She said that she has pressure in her left ear with swollen glands in her neck.  She denies any fevers.      History reviewed. No pertinent past medical history.  There are no active problems to display for this patient.   Past Surgical History:  Procedure Laterality Date  . HERNIA REPAIR    . TONSILLECTOMY      OB History    No data available       Home Medications    Prior to Admission medications   Medication Sig Start Date End Date Taking? Authorizing Provider  albuterol (PROVENTIL HFA;VENTOLIN HFA) 108 (90 Base) MCG/ACT inhaler Inhale 1-2 puffs into the lungs every 6 (six) hours as needed for wheezing or shortness of breath. 10/04/16   Rise MuKenneth T Leaphart, PA-C  benzonatate (TESSALON) 100 MG capsule Take 1 capsule (100 mg total) by mouth every 8 (eight) hours. 10/04/16   Rise MuKenneth T Leaphart, PA-C  HYDROcodone-acetaminophen (NORCO/VICODIN) 5-325 MG tablet Take 1 tablet by mouth every 4 (four) hours as needed for severe pain. 06/09/16   Ace GinsSerena Y Sam, PA-C  ibuprofen (ADVIL,MOTRIN) 600 MG tablet Take 1 tablet (600 mg total) by mouth every 6 (six) hours as needed. 01/24/17   Jacalyn LefevreJulie Jachob Mcclean, MD  loratadine (CLARITIN) 10 MG tablet Take 1 tablet (10 mg total) by mouth daily. 01/24/17   Jacalyn LefevreJulie Jeanetta Alonzo, MD  methocarbamol (ROBAXIN) 500 MG tablet Take 1 tablet (500 mg total) by mouth 2 (two) times daily. 06/09/16   Ace GinsSerena Y Sam, PA-C  naproxen (NAPROSYN) 500 MG tablet Take 1 tablet (500 mg total) by mouth 2 (two) times daily. 06/09/16   Ace GinsSerena Y Sam, PA-C  predniSONE (DELTASONE) 20 MG tablet Take 2 tablets (40 mg total) by mouth daily with breakfast. 10/04/16   Rise MuKenneth T Leaphart, PA-C    Family History No family history  on file.  Social History Social History  Substance Use Topics  . Smoking status: Current Every Day Smoker    Packs/day: 0.50    Types: Cigarettes  . Smokeless tobacco: Never Used  . Alcohol use Yes     Comment: rare     Allergies   Patient has no known allergies.   Review of Systems Review of Systems  HENT: Positive for congestion, ear pain and sore throat.   All other systems reviewed and are negative.    Physical Exam Updated Vital Signs BP 123/88   Pulse 74   Temp 98.3 F (36.8 C) (Oral)   Resp 18   Ht 5\' 4"  (1.626 m)   Wt 236 lb (107 kg)   SpO2 100%   BMI 40.51 kg/m   Physical Exam  Constitutional: She is oriented to person, place, and time. She appears well-developed and well-nourished.  HENT:  Head: Normocephalic and atraumatic.  Right Ear: External ear normal.  Left Ear: External ear normal.  Nose: Nose normal.  Mouth/Throat: Oropharynx is clear and moist.  Eyes: Conjunctivae and EOM are normal. Pupils are equal, round, and reactive to light.  Neck: Normal range of motion. Neck supple.  Cardiovascular: Normal rate, regular rhythm, normal heart sounds and intact distal pulses.   Pulmonary/Chest: Effort normal and breath sounds  normal.  Abdominal: Soft. Bowel sounds are normal.  Musculoskeletal: Normal range of motion.  Neurological: She is alert and oriented to person, place, and time.  Skin: Skin is warm.  Psychiatric: She has a normal mood and affect. Her behavior is normal. Judgment and thought content normal.  Nursing note and vitals reviewed.    ED Treatments / Results  Labs (all labs ordered are listed, but only abnormal results are displayed) Labs Reviewed  RAPID STREP SCREEN (NOT AT [facility])  CULTURE, GROUP A STREP [facility])  PREGNANCY, URINE  URINALYSIS, ROUTINE W REFLEX MICROSCOPIC    EKG  EKG Interpretation  Date/Time:  Thursday Jan 24 2017 11:12:06 EDT Ventricular Rate:  74 PR Interval:    QRS Duration: 78 QT Interval:  380 QTC  Calculation: 422 R Axis:   65 Text Interpretation:  Sinus rhythm Confirmed by Eames Dibiasio MD, Jordan Pardini ([id number]) on 01/24/2017 12:56:26 PM       Radiology No results found.  Procedures Procedures (including critical care time)  Medications Ordered in ED Medications  oxymetazoline (AFRIN) 0.05 % nasal spray 1 spray (not administered)  acetaminophen (TYLENOL) tablet 1,000 mg (1,000 mg Oral Given 01/24/17 1138)     Initial Impression / Assessment and Plan / ED Course  I have reviewed the triage vital signs and the nursing notes.  Pertinent labs & imaging results that were available during my care of the patient were reviewed by me and considered in my medical decision making (see chart for details).     Pt looks good.  She likely has a viral syndrome.  She knows to return if worse.  She is stable for d/c.  Final Clinical Impressions(s) / ED Diagnoses   Final diagnoses:  Viral upper respiratory tract infection    New Prescriptions New Prescriptions   IBUPROFEN (ADVIL,MOTRIN) 600 MG TABLET    Take 1 tablet (600 mg total) by mouth every 6 (six) hours as needed.   LORATADINE (CLARITIN) 10 MG TABLET    Take 1 tablet (10 mg total) by mouth daily.     Jacalyn Lefevre, MD 01/24/17 1308

## 2017-01-26 LAB — CULTURE, GROUP A STREP (THRC)

## 2017-02-19 ENCOUNTER — Emergency Department (HOSPITAL_BASED_OUTPATIENT_CLINIC_OR_DEPARTMENT_OTHER): Payer: No Typology Code available for payment source

## 2017-02-19 ENCOUNTER — Emergency Department (HOSPITAL_BASED_OUTPATIENT_CLINIC_OR_DEPARTMENT_OTHER)
Admission: EM | Admit: 2017-02-19 | Discharge: 2017-02-19 | Disposition: A | Discharge status: Home / Self Care | Payer: No Typology Code available for payment source | Attending: Emergency Medicine | Admitting: Emergency Medicine

## 2017-02-19 ENCOUNTER — Encounter (HOSPITAL_BASED_OUTPATIENT_CLINIC_OR_DEPARTMENT_OTHER): Payer: Self-pay | Admitting: Emergency Medicine

## 2017-02-19 DIAGNOSIS — F17210 Nicotine dependence, cigarettes, uncomplicated: Secondary | ICD-10-CM | POA: Insufficient documentation

## 2017-02-19 DIAGNOSIS — R1013 Epigastric pain: Secondary | ICD-10-CM

## 2017-02-19 DIAGNOSIS — K529 Noninfective gastroenteritis and colitis, unspecified: Secondary | ICD-10-CM | POA: Diagnosis not present

## 2017-02-19 LAB — CBC WITH DIFFERENTIAL/PLATELET
Basophils Absolute: 0 10*3/uL (ref 0.0–0.1)
Basophils Relative: 0 %
Eosinophils Absolute: 0.1 10*3/uL (ref 0.0–0.7)
Eosinophils Relative: 1 %
HCT: 38.9 % (ref 36.0–46.0)
Hemoglobin: 13.9 g/dL (ref 12.0–15.0)
Lymphocytes Relative: 12 %
Lymphs Abs: 1 10*3/uL (ref 0.7–4.0)
MCH: 32 pg (ref 26.0–34.0)
MCHC: 35.7 g/dL (ref 30.0–36.0)
MCV: 89.6 fL (ref 78.0–100.0)
Monocytes Absolute: 0.6 10*3/uL (ref 0.1–1.0)
Monocytes Relative: 6 %
Neutro Abs: 7 10*3/uL (ref 1.7–7.7)
Neutrophils Relative %: 81 %
Platelets: 230 10*3/uL (ref 150–400)
RBC: 4.34 MIL/uL (ref 3.87–5.11)
RDW: 11.6 % (ref 11.5–15.5)
WBC: 8.7 10*3/uL (ref 4.0–10.5)

## 2017-02-19 LAB — URINALYSIS, ROUTINE W REFLEX MICROSCOPIC
Bilirubin Urine: NEGATIVE
GLUCOSE, UA: NEGATIVE mg/dL
Hgb urine dipstick: NEGATIVE
Ketones, ur: 15 mg/dL — AB
LEUKOCYTES UA: NEGATIVE
Nitrite: NEGATIVE
PROTEIN: NEGATIVE mg/dL
Specific Gravity, Urine: 1.028 (ref 1.005–1.030)
pH: 5.5 (ref 5.0–8.0)

## 2017-02-19 LAB — COMPREHENSIVE METABOLIC PANEL
ALT: 14 U/L (ref 14–54)
AST: 16 U/L (ref 15–41)
Albumin: 4.2 g/dL (ref 3.5–5.0)
Alkaline Phosphatase: 27 U/L — ABNORMAL LOW (ref 38–126)
Anion gap: 6 (ref 5–15)
BUN: 16 mg/dL (ref 6–20)
CO2: 25 mmol/L (ref 22–32)
Calcium: 8.9 mg/dL (ref 8.9–10.3)
Chloride: 104 mmol/L (ref 101–111)
Creatinine, Ser: 0.67 mg/dL (ref 0.44–1.00)
GFR calc Af Amer: >60 mL/min (ref >60)
GFR calc non Af Amer: >60 mL/min (ref >60)
Glucose, Bld: 110 mg/dL — ABNORMAL HIGH (ref 65–99)
Potassium: 3.3 mmol/L — ABNORMAL LOW (ref 3.5–5.1)
Sodium: 135 mmol/L (ref 135–145)
Total Bilirubin: 0.4 mg/dL (ref 0.3–1.2)
Total Protein: 7.4 g/dL (ref 6.5–8.1)

## 2017-02-19 LAB — PREGNANCY, URINE: Preg Test, Ur: NEGATIVE

## 2017-02-19 LAB — LIPASE, BLOOD: Lipase: 17 U/L (ref 11–51)

## 2017-02-19 MED ORDER — ONDANSETRON HCL 8 MG PO TABS
4.0000 mg | ORAL_TABLET | Freq: Once | ORAL | Status: AC
  Administered 2017-02-19: 4 mg via ORAL
  Filled 2017-02-19: qty 1

## 2017-02-19 MED ORDER — ONDANSETRON HCL 4 MG PO TABS: 4.0000 mg | ORAL_TABLET | Freq: Three times a day (TID) | ORAL | 0 refills | Status: AC | PRN

## 2017-02-19 MED ORDER — IOPAMIDOL (ISOVUE-300) INJECTION 61%
100.0000 mL | Freq: Once | INTRAVENOUS | Status: AC | PRN
  Administered 2017-02-19: 100 mL via INTRAVENOUS

## 2017-02-19 NOTE — ED Notes (Signed)
Patient in CT at this time will obtain vitals when she returns.

## 2017-02-19 NOTE — Discharge Instructions (Signed)
Take Zofran as needed for nausea and vomiting. Daily bland diet with plenty of fluids and rest. Follow-up with primary care for reevaluation. Return to the ED if any concerning symptoms develop.

## 2017-02-19 NOTE — ED Provider Notes (Signed)
[facility]   CSN: [id number] Arrival date & time: 02/19/17  0946     History   Chief Complaint Chief Complaint  Patient presents with  . Abdominal Pain    HPI Virginia Wagner is a 31 y.o. female with chief complaint acute onset, intermittent severe epigastric abdominal pain since 3 AM this morning. Patient states that pain awoke her. It is associated with nausea, but no vomiting. She states pain is worsened standing straight up, and alleviated by laying down. She had 1 episode of watery, nonbloody diarrhea last night. She had low back pain last night which has resolved. She has tried Zantac and Tums with no relief of her symptoms. Last oral intake was a chicken sandwich from [facility] [employer] last night. She denies any known sick contacts. She denies fever, chills, dysuria, hematuria, melena, hematochezia, or vaginal itching, pain, discharge, bleeding. She states she is in a monogamous relationship of 3 years. She has had an umbilical hernia repair, but denies any new abdominal masses.She states she drinks alcohol rarely, and quit smoking in January. The history is provided by the patient.    History reviewed. No pertinent past medical history.  There are no active problems to display for this patient.   Past Surgical History:  Procedure Laterality Date  . HERNIA REPAIR    . TONSILLECTOMY      OB History    No data available       Home Medications    Prior to Admission medications   Medication Sig Start Date End Date Taking? Authorizing Provider  albuterol (PROVENTIL HFA;VENTOLIN HFA) 108 (90 Base) MCG/ACT inhaler Inhale 1-2 puffs into the lungs every 6 (six) hours as needed for wheezing or shortness of breath. 10/04/16   Rise MuLeaphart, Kenneth T, PA-C  benzonatate (TESSALON) 100 MG capsule Take 1 capsule (100 mg total) by mouth every 8 (eight) hours. 10/04/16   Rise MuLeaphart, Kenneth T, PA-C  HYDROcodone-acetaminophen (NORCO/VICODIN) 5-325 MG tablet Take 1 tablet by  mouth every 4 (four) hours as needed for severe pain. 06/09/16   Sam, Ace GinsSerena Y, PA-C  ibuprofen (ADVIL,MOTRIN) 600 MG tablet Take 1 tablet (600 mg total) by mouth every 6 (six) hours as needed. 01/24/17   Jacalyn LefevreHaviland, Julie, MD  loratadine (CLARITIN) 10 MG tablet Take 1 tablet (10 mg total) by mouth daily. 01/24/17   Jacalyn LefevreHaviland, Julie, MD  methocarbamol (ROBAXIN) 500 MG tablet Take 1 tablet (500 mg total) by mouth 2 (two) times daily. 06/09/16   Sam, Ace GinsSerena Y, PA-C  naproxen (NAPROSYN) 500 MG tablet Take 1 tablet (500 mg total) by mouth 2 (two) times daily. 06/09/16   Sam, Ace GinsSerena Y, PA-C  ondansetron (ZOFRAN) 4 MG tablet Take 1 tablet (4 mg total) by mouth every 8 (eight) hours as needed for nausea or vomiting. 02/19/17 02/22/17  Michela PitcherFawze, Lekeith Wulf A, PA-C  predniSONE (DELTASONE) 20 MG tablet Take 2 tablets (40 mg total) by mouth daily with breakfast. 10/04/16   Rise MuLeaphart, Kenneth T, PA-C    Family History No family history on file.  Social History Social History  Substance Use Topics  . Smoking status: Current Every Day Smoker    Packs/day: 0.50    Types: Cigarettes  . Smokeless tobacco: Never Used  . Alcohol use Yes     Comment: rare     Allergies   Patient has no known allergies.   Review of Systems Review of Systems  Constitutional: Negative for chills and fever.  Respiratory: Negative for shortness of breath.  Cardiovascular: Negative for chest pain.  Gastrointestinal: Positive for abdominal pain, diarrhea and nausea. Negative for blood in stool, constipation and vomiting.  Genitourinary: Negative for dysuria, flank pain, hematuria, vaginal bleeding, vaginal discharge and vaginal pain.  Musculoskeletal: Negative for back pain.  Neurological: Negative for syncope, light-headedness and headaches.  All other systems reviewed and are negative.    Physical Exam Updated Vital Signs BP 133/86 (BP Location: Right Arm)   Pulse 88   Temp 98.2 F (36.8 C) (Oral)   Resp 18   Ht 5\' 4"  (1.626 m)    Wt 108.9 kg (240 lb)   LMP 01/14/2017   SpO2 100%   BMI 41.20 kg/m   Physical Exam  Constitutional: She appears well-developed and well-nourished. No distress.  HENT:  Head: Normocephalic and atraumatic.  Eyes: Conjunctivae are normal. Right eye exhibits no discharge. Left eye exhibits no discharge.  Neck: No JVD present. No tracheal deviation present.  Cardiovascular: Normal rate, regular rhythm and normal heart sounds.   Pulmonary/Chest: Effort normal and breath sounds normal.  Abdominal: Soft. She exhibits no distension and no mass. There is tenderness. There is no rebound and no guarding.  Hypoactive bowel sounds, right lower quadrant tender to palpation. No tenderness to palpation at McBurney's point, Rovsing's absent, Murphy's sign absent, Psoas sign absent.   Genitourinary:  Genitourinary Comments: No CVA tenderness  Musculoskeletal: She exhibits no edema.  No midline spine tenderness palpation, no paraspinal muscle tenderness to palpation  Neurological: She is alert.  Skin: Skin is warm and dry. She is not diaphoretic.  Psychiatric: She has a normal mood and affect. Her behavior is normal.     ED Treatments / Results  Labs (all labs ordered are listed, but only abnormal results are displayed) Labs Reviewed  URINALYSIS, ROUTINE W REFLEX MICROSCOPIC - Abnormal; Notable for the following:       Result Value   APPearance CLOUDY (*)    Ketones, ur 15 (*)    All other components within normal limits  COMPREHENSIVE METABOLIC PANEL - Abnormal; Notable for the following:    Potassium 3.3 (*)    Glucose, Bld 110 (*)    Alkaline Phosphatase 27 (*)    All other components within normal limits  PREGNANCY, URINE  CBC WITH DIFFERENTIAL/PLATELET  LIPASE, BLOOD    EKG  EKG Interpretation  Date/Time:  Tuesday Feb 19 2017 11:16:50 EDT Ventricular Rate:  79 PR Interval:    QRS Duration: 85 QT Interval:  370 QTC Calculation: 425 R Axis:   65 Text Interpretation:  Sinus  rhythm Confirmed by Vanetta Mulders [phone]) on 02/19/2017 11:19:28 AM       Radiology Ct Abdomen Pelvis W Contrast  Result Date: 02/19/2017 CLINICAL DATA:  Abdominal pain and nausea and vomiting. EXAM: CT ABDOMEN AND PELVIS WITH CONTRAST TECHNIQUE: Multidetector CT imaging of the abdomen and pelvis was performed using the standard protocol following bolus administration of intravenous contrast. CONTRAST:  ISOVUE-300 IOPAMIDOL (ISOVUE-300) INJECTION 61% COMPARISON:  None. FINDINGS: Lower chest: Normal. Hepatobiliary: Focal fatty infiltration adjacent to the falciform ligament. Otherwise normal. Biliary tree is normal. Pancreas: Unremarkable. No pancreatic ductal dilatation or surrounding inflammatory changes. Spleen: Normal in size without focal abnormality. Adrenals/Urinary Tract: Adrenal glands are unremarkable. Kidneys are normal, without renal calculi, focal lesion, or hydronephrosis. Bladder is unremarkable. Stomach/Bowel: There are multiple edematous loops of small bowel in the right mid abdomen in the pelvis involving the ileum. This edema extends into the terminal ileum. Tiny appendicolith in an otherwise  normal appendix. The colon appears normal. Vascular/Lymphatic: No significant vascular findings are present. No enlarged abdominal or pelvic lymph nodes. Reproductive: Small amount of free fluid in the pelvis, nonspecific in a female of this age. Other: No abdominal wall hernia or abnormality. Musculoskeletal: No acute or significant osseous findings. IMPRESSION: Enteritis involving the ileum. Electronically Signed   By: Francene Boyers M.D.   On: 02/19/2017 12:25    Procedures Procedures (including critical care time)  Medications Ordered in ED Medications  ondansetron (ZOFRAN) tablet 4 mg (4 mg Oral Given 02/19/17 1034)  iopamidol (ISOVUE-300) 61 % injection 100 mL (100 mLs Intravenous Contrast Given 02/19/17 1157)     Initial Impression / Assessment and Plan / ED Course  I have  reviewed the triage vital signs and the nursing notes.  Pertinent labs & imaging results that were available during my care of the patient were reviewed by me and considered in my medical decision making (see chart for details).     Patient with abdominal pain and nausea. Afebrile, vital signs stable. EKG shows sinus rhythm without abnormalities. Low suspicion of ACS or MI. Labwork not concerning for bacterial infection. UA not concerning for UTI, pyelonephritis, nephrolithiasis. CT abdomen pelvis shows enteritis involving the ileum, likely viral due to suspicious oral fluid intake. Low suspicion of ectopic pregnancy, appendicitis, TOA, PID. Discussed use of Zofran for nausea, bland diet and plenty fluids and rest. Recommend follow-up with primary care this week for reevaluation. Discussed indications for return to the ED. Pt verbalized understanding of and agreement with plan and is safe for discharge home at this time.   Final Clinical Impressions(s) / ED Diagnoses   Final diagnoses:  Enteritis  Epigastric pain    New Prescriptions Discharge Medication List as of 02/19/2017  1:17 PM    START taking these medications   Details  ondansetron (ZOFRAN) 4 MG tablet Take 1 tablet (4 mg total) by mouth every 8 (eight) hours as needed for nausea or vomiting., Starting Tue 02/19/2017, Until Fri 02/22/2017, Print         Meilani Edmundson, Holley A, PA-C 02/19/17 1731    Vanetta Mulders, MD 02/21/17 [phone]

## 2017-02-19 NOTE — ED Notes (Signed)
Per CT staff, pt vomited after receiving IV contrast. Pt sts she feels better now, denies nausea.

## 2017-02-19 NOTE — ED Triage Notes (Signed)
abd pain with nausea since 3 this morning.

## 2018-12-05 ENCOUNTER — Other Ambulatory Visit: Payer: Self-pay

## 2018-12-05 ENCOUNTER — Emergency Department (HOSPITAL_BASED_OUTPATIENT_CLINIC_OR_DEPARTMENT_OTHER)
Admission: EM | Admit: 2018-12-05 | Discharge: 2018-12-05 | Disposition: A | Discharge status: Home / Self Care | Payer: 59 | Attending: Emergency Medicine | Admitting: Emergency Medicine

## 2018-12-05 ENCOUNTER — Encounter (HOSPITAL_BASED_OUTPATIENT_CLINIC_OR_DEPARTMENT_OTHER): Payer: Self-pay | Admitting: Emergency Medicine

## 2018-12-05 ENCOUNTER — Emergency Department (HOSPITAL_BASED_OUTPATIENT_CLINIC_OR_DEPARTMENT_OTHER): Payer: 59

## 2018-12-05 DIAGNOSIS — R0789 Other chest pain: Secondary | ICD-10-CM | POA: Insufficient documentation

## 2018-12-05 DIAGNOSIS — Z79899 Other long term (current) drug therapy: Secondary | ICD-10-CM | POA: Diagnosis not present

## 2018-12-05 DIAGNOSIS — R079 Chest pain, unspecified: Secondary | ICD-10-CM

## 2018-12-05 DIAGNOSIS — F17210 Nicotine dependence, cigarettes, uncomplicated: Secondary | ICD-10-CM | POA: Diagnosis not present

## 2018-12-05 LAB — BASIC METABOLIC PANEL
ANION GAP: 7 (ref 5–15)
BUN: 13 mg/dL (ref 6–20)
CALCIUM: 9.1 mg/dL (ref 8.9–10.3)
CHLORIDE: 105 mmol/L (ref 98–111)
CO2: 24 mmol/L (ref 22–32)
Creatinine, Ser: 0.77 mg/dL (ref 0.44–1.00)
GFR calc non Af Amer: >60 mL/min (ref >60)
GLUCOSE: 107 mg/dL — AB (ref 70–99)
Potassium: 3.2 mmol/L — ABNORMAL LOW (ref 3.5–5.1)
Sodium: 136 mmol/L (ref 135–145)

## 2018-12-05 LAB — CBC
HEMATOCRIT: 42.3 % (ref 36.0–46.0)
HEMOGLOBIN: 13.9 g/dL (ref 12.0–15.0)
MCH: 30.2 pg (ref 26.0–34.0)
MCHC: 32.9 g/dL (ref 30.0–36.0)
MCV: 92 fL (ref 80.0–100.0)
NRBC: 0 % (ref 0.0–0.2)
PLATELETS: 258 10*3/uL (ref 150–400)
RBC: 4.6 MIL/uL (ref 3.87–5.11)
RDW: 12.7 % (ref 11.5–15.5)
WBC: 4.5 10*3/uL (ref 4.0–10.5)

## 2018-12-05 LAB — PREGNANCY, URINE: Preg Test, Ur: NEGATIVE

## 2018-12-05 LAB — D-DIMER, QUANTITATIVE (NOT AT ARMC)

## 2018-12-05 LAB — TROPONIN I: Troponin I: <0.03 ng/mL (ref <0.03)

## 2018-12-05 MED ORDER — POTASSIUM CHLORIDE CRYS ER 20 MEQ PO TBCR
40.0000 meq | EXTENDED_RELEASE_TABLET | Freq: Once | ORAL | Status: AC
  Administered 2018-12-05: 40 meq via ORAL
  Filled 2018-12-05: qty 2

## 2018-12-05 NOTE — ED Triage Notes (Signed)
C/o midsternal chest pain x 3 days.  Worse today.  Reports worse with moving and on inspiration.

## 2018-12-05 NOTE — ED Provider Notes (Signed)
[facility] EMERGENCY DEPARTMENT Provider Note   CSN: [id number] Arrival date & time: 12/05/18  1423    History   Chief Complaint Chief Complaint  Patient presents with  . Chest Pain    HPI Virginia Wagner is a 33 y.o. female.  HPI: A 33 year old patient with a history of hypertension and obesity presents for evaluation of chest pain. Initial onset of pain was more than 6 hours ago. The patient's chest pain is not worse with exertion. The patient's chest pain is middle- or left-sided, is not well-localized, is not described as heaviness/pressure/tightness, is not sharp and does not radiate to the arms/jaw/neck. The patient does not complain of nausea and denies diaphoresis. The patient has no history of stroke, has no history of peripheral artery disease, has not smoked in the past 90 days, denies any history of treated diabetes, has no relevant family history of coronary artery disease (first degree relative at less than age 4) and has no history of hypercholesterolemia.   Patient is a 33 year old female with a history of hypertension and obesity who presents with chest pain.  She has a 2-day history of intermittent pain to the center of her chest.  She says initially felt a little sore when she pushed on it but now it hurts when she moves or shifts her weight and also when she takes a deep breath.  She denies any shortness of breath.  She describes it as an achy type pain.  It is otherwise nonradiating.  No diaphoresis.  No nausea or vomiting.  No associated URI symptoms.  No fevers.  She recently had started amlodipine and has had some intermittent swelling of her feet and ankles since that time but no unilateral swelling.  No history of heart problems in the past.  She is a non-smoker.     History reviewed. No pertinent past medical history.  There are no active problems to display for this patient.   Past Surgical History:  Procedure Laterality Date  . HERNIA REPAIR     . TONSILLECTOMY       OB History   No obstetric history on file.      Home Medications    Prior to Admission medications   Medication Sig Start Date End Date Taking? Authorizing Provider  albuterol (PROVENTIL HFA;VENTOLIN HFA) 108 (90 Base) MCG/ACT inhaler Inhale 1-2 puffs into the lungs every 6 (six) hours as needed for wheezing or shortness of breath. 10/04/16   Rise Mu, PA-C  benzonatate (TESSALON) 100 MG capsule Take 1 capsule (100 mg total) by mouth every 8 (eight) hours. 10/04/16   Rise Mu, PA-C  HYDROcodone-acetaminophen (NORCO/VICODIN) 5-325 MG tablet Take 1 tablet by mouth every 4 (four) hours as needed for severe pain. 06/09/16   Sam, Ace Gins, PA-C  ibuprofen (ADVIL,MOTRIN) 600 MG tablet Take 1 tablet (600 mg total) by mouth every 6 (six) hours as needed. 01/24/17   Jacalyn Lefevre, MD  loratadine (CLARITIN) 10 MG tablet Take 1 tablet (10 mg total) by mouth daily. 01/24/17   Jacalyn Lefevre, MD  methocarbamol (ROBAXIN) 500 MG tablet Take 1 tablet (500 mg total) by mouth 2 (two) times daily. 06/09/16   Sam, Ace Gins, PA-C  naproxen (NAPROSYN) 500 MG tablet Take 1 tablet (500 mg total) by mouth 2 (two) times daily. 06/09/16   Sam, Ace Gins, PA-C  predniSONE (DELTASONE) 20 MG tablet Take 2 tablets (40 mg total) by mouth daily with breakfast. 10/04/16   Rise Mu,  PA-C    Family History History reviewed. No pertinent family history.  Social History Social History   Tobacco Use  . Smoking status: Current Every Day Smoker    Packs/day: 0.50    Types: Cigarettes  . Smokeless tobacco: Never Used  Substance Use Topics  . Alcohol use: Yes    Comment: rare  . Drug use: No     Allergies   Patient has no known allergies.   Review of Systems Review of Systems  Constitutional: Negative for chills, diaphoresis, fatigue and fever.  HENT: Negative for congestion, rhinorrhea and sneezing.   Eyes: Negative.   Respiratory: Negative for cough, chest  tightness and shortness of breath.   Cardiovascular: Positive for chest pain and leg swelling.  Gastrointestinal: Negative for abdominal pain, blood in stool, diarrhea, nausea and vomiting.  Genitourinary: Negative for difficulty urinating, flank pain, frequency and hematuria.  Musculoskeletal: Negative for arthralgias and back pain.  Skin: Negative for rash.  Neurological: Negative for dizziness, speech difficulty, weakness, numbness and headaches.     Physical Exam Updated Vital Signs BP (!) 151/95 (BP Location: Left Arm)   Pulse (!) 116   Temp 98.3 F (36.8 C) (Oral)   Resp 18   Ht 5\' 4"  (1.626 m)   Wt 109.8 kg   SpO2 100%   BMI 41.54 kg/m   Physical Exam Constitutional:      Appearance: She is well-developed.  HENT:     Head: Normocephalic and atraumatic.  Eyes:     Pupils: Pupils are equal, round, and reactive to light.  Neck:     Musculoskeletal: Normal range of motion and neck supple.  Cardiovascular:     Rate and Rhythm: Normal rate and regular rhythm.     Heart sounds: Normal heart sounds.  Pulmonary:     Effort: Pulmonary effort is normal. No respiratory distress.     Breath sounds: Normal breath sounds. No wheezing or rales.  Chest:     Chest wall: No tenderness.  Abdominal:     General: Bowel sounds are normal.     Palpations: Abdomen is soft.     Tenderness: There is no abdominal tenderness. There is no guarding or rebound.  Musculoskeletal: Normal range of motion.     Comments: No edema or calf tenderness  Lymphadenopathy:     Cervical: No cervical adenopathy.  Skin:    General: Skin is warm and dry.     Findings: No rash.  Neurological:     Mental Status: She is alert and oriented to person, place, and time.      ED Treatments / Results  Labs (all labs ordered are listed, but only abnormal results are displayed) Labs Reviewed  BASIC METABOLIC PANEL - Abnormal; Notable for the following components:      Result Value   Potassium 3.2 (*)     Glucose, Bld 107 (*)    All other components within normal limits  CBC  TROPONIN I  PREGNANCY, URINE  D-DIMER, QUANTITATIVE (NOT AT [facility])    EKG EKG Interpretation  Date/Time:  Friday December 05 2018 14:29:45 EDT Ventricular Rate:  107 PR Interval:  140 QRS Duration: 74 QT Interval:  330 QTC Calculation: 440 R Axis:   59 Text Interpretation:  Sinus tachycardia Otherwise normal ECG SINCE LAST TRACING HEART RATE HAS INCREASED Confirmed by Rolan Bucco [phone]) on 12/05/2018 3:07:53 PM   Radiology Dg Chest 2 View  Result Date: 12/05/2018 CLINICAL DATA:  Chest pain. EXAM: CHEST - 2  VIEW COMPARISON:  Radiographs of February 23, 2018. FINDINGS: The heart size and mediastinal contours are within normal limits. Both lungs are clear. No pneumothorax or pleural effusion is noted. The visualized skeletal structures are unremarkable. IMPRESSION: No active cardiopulmonary disease. Electronically Signed   By: Lupita RaiderJames  Green Jr, M.D.   On: 12/05/2018 14:55    Procedures Procedures (including critical care time)  Medications Ordered in ED Medications  potassium chloride SA (K-DUR,KLOR-CON) CR tablet 40 mEq (has no administration in time range)     Initial Impression / Assessment and Plan / ED Course  I have reviewed the triage vital signs and the nursing notes.  Pertinent labs & imaging results that were available during my care of the patient were reviewed by me and considered in my medical decision making (see chart for details).     HEAR Score: 1  Patient presents with chest pain that is worse with movement and inspiration.  Her EKG is normal other than tachycardia.  There is no ischemic changes.  Her heart rate has normalized in the ED.  She has no associated symptoms.  Her chest x-ray is clear without evidence of pneumonia or pneumothorax.  Her d-dimer is normal and her symptoms are not otherwise consistent with PE.  I feel this is likely musculoskeletal in nature.  She was discharged home  in good condition.  Symptomatic care instructions were given.  She was encouraged to follow-up with her PCP.  She was also advised that her potassium is slightly low and needs to be rechecked by her PCP.  Final Clinical Impressions(s) / ED Diagnoses   Final diagnoses:  Nonspecific chest pain    ED Discharge Orders    None       Rolan BuccoBelfi, Evangelina Delancey, MD 12/05/18 1554

## 2018-12-05 NOTE — ED Triage Notes (Signed)
Delay in EKG-patient in bathroom when called from lobby.

## 2018-12-05 NOTE — ED Notes (Signed)
ED Provider at bedside. 

## 2018-12-05 NOTE — Discharge Instructions (Addendum)
Your potassium is slightly low and needs to be rechecked by your primary care doctor.

## 2019-02-08 ENCOUNTER — Other Ambulatory Visit: Payer: Self-pay

## 2019-02-08 ENCOUNTER — Encounter (HOSPITAL_BASED_OUTPATIENT_CLINIC_OR_DEPARTMENT_OTHER): Payer: Self-pay | Admitting: Emergency Medicine

## 2019-02-08 ENCOUNTER — Emergency Department (HOSPITAL_BASED_OUTPATIENT_CLINIC_OR_DEPARTMENT_OTHER)
Admission: EM | Admit: 2019-02-08 | Discharge: 2019-02-08 | Disposition: A | Discharge status: Home / Self Care | Payer: 59 | Attending: Emergency Medicine | Admitting: Emergency Medicine

## 2019-02-08 DIAGNOSIS — Z87891 Personal history of nicotine dependence: Secondary | ICD-10-CM | POA: Diagnosis not present

## 2019-02-08 DIAGNOSIS — I10 Essential (primary) hypertension: Secondary | ICD-10-CM | POA: Insufficient documentation

## 2019-02-08 DIAGNOSIS — Z79899 Other long term (current) drug therapy: Secondary | ICD-10-CM | POA: Insufficient documentation

## 2019-02-08 DIAGNOSIS — L509 Urticaria, unspecified: Secondary | ICD-10-CM | POA: Insufficient documentation

## 2019-02-08 HISTORY — DX: Essential (primary) hypertension: I10

## 2019-02-08 MED ORDER — DIPHENHYDRAMINE HCL 25 MG PO TABS: 50.0000 mg | ORAL_TABLET | Freq: Four times a day (QID) | ORAL | 0 refills | Status: DC

## 2019-02-08 MED ORDER — FAMOTIDINE 20 MG PO TABS
20.0000 mg | ORAL_TABLET | Freq: Once | ORAL | Status: AC
  Administered 2019-02-08: 23:00:00 20 mg via ORAL
  Filled 2019-02-08: qty 1

## 2019-02-08 MED ORDER — PREDNISONE 50 MG PO TABS
60.0000 mg | ORAL_TABLET | Freq: Once | ORAL | Status: AC
  Administered 2019-02-08: 23:00:00 60 mg via ORAL
  Filled 2019-02-08: qty 1

## 2019-02-08 MED ORDER — PREDNISONE 20 MG PO TABS: 40.0000 mg | ORAL_TABLET | Freq: Every day | ORAL | 0 refills | Status: AC

## 2019-02-08 MED ORDER — FAMOTIDINE 20 MG PO TABS: 20.0000 mg | ORAL_TABLET | Freq: Two times a day (BID) | ORAL | 0 refills | Status: DC

## 2019-02-08 NOTE — ED Provider Notes (Signed)
[facility] EMERGENCY DEPARTMENT Provider Note   CSN: [id number] Arrival date & time: 02/08/19  2215    History   Chief Complaint Chief Complaint  Patient presents with  . Urticaria    HPI Virginia Wagner is a 33 y.o. female.     HPI Patient is a 33 year old female resents the emergency department with complaints of new urticaria which developed this evening.  No history of prior allergic reactions.  No difficulty breathing or swallowing.  No shortness of breath.  She tried Benadryl at home but feels as though her urticaria has progressed. Thing that is new in her environment is that she began using a new detergent recently.  She believes the sure that she was wearing had been laundered in the new detergent.  There is nothing else new in her diet or her household.   Past Medical History:  Diagnosis Date  . Hypertension     There are no active problems to display for this patient.   Past Surgical History:  Procedure Laterality Date  . HERNIA REPAIR    . TONSILLECTOMY       OB History   No obstetric history on file.      Home Medications    Prior to Admission medications   Medication Sig Start Date End Date Taking? Authorizing Provider  albuterol (PROVENTIL HFA;VENTOLIN HFA) 108 (90 Base) MCG/ACT inhaler Inhale 1-2 puffs into the lungs every 6 (six) hours as needed for wheezing or shortness of breath. 10/04/16   Rise MuLeaphart, Kenneth T, PA-C  benzonatate (TESSALON) 100 MG capsule Take 1 capsule (100 mg total) by mouth every 8 (eight) hours. 10/04/16   Rise MuLeaphart, Kenneth T, PA-C  diphenhydrAMINE (BENADRYL) 25 MG tablet Take 2 tablets (50 mg total) by mouth every 6 (six) hours for 3 days. 02/08/19 02/11/19  Azalia Bilisampos, Brookelle Pellicane, MD  famotidine (PEPCID) 20 MG tablet Take 1 tablet (20 mg total) by mouth 2 (two) times daily. 02/08/19   Azalia Bilisampos, Caralynn Gelber, MD  HYDROcodone-acetaminophen (NORCO/VICODIN) 5-325 MG tablet Take 1 tablet by mouth every 4 (four) hours as needed for severe  pain. 06/09/16   Sam, Ace GinsSerena Y, PA-C  ibuprofen (ADVIL,MOTRIN) 600 MG tablet Take 1 tablet (600 mg total) by mouth every 6 (six) hours as needed. 01/24/17   Jacalyn LefevreHaviland, Julie, MD  loratadine (CLARITIN) 10 MG tablet Take 1 tablet (10 mg total) by mouth daily. 01/24/17   Jacalyn LefevreHaviland, Julie, MD  methocarbamol (ROBAXIN) 500 MG tablet Take 1 tablet (500 mg total) by mouth 2 (two) times daily. 06/09/16   Sam, Ace GinsSerena Y, PA-C  naproxen (NAPROSYN) 500 MG tablet Take 1 tablet (500 mg total) by mouth 2 (two) times daily. 06/09/16   Sam, Ace GinsSerena Y, PA-C  predniSONE (DELTASONE) 20 MG tablet Take 2 tablets (40 mg total) by mouth daily for 5 days. 02/08/19 02/13/19  Azalia Bilisampos, Charlies Rayburn, MD    Family History No family history on file.  Social History Social History   Tobacco Use  . Smoking status: Former Smoker    Packs/day: 0.50    Types: Cigarettes    Last attempt to quit: 2017    Years since quitting: 3.3  . Smokeless tobacco: Never Used  Substance Use Topics  . Alcohol use: Yes    Comment: rare  . Drug use: No     Allergies   Patient has no known allergies.   Review of Systems Review of Systems  All other systems reviewed and are negative.    Physical Exam Updated Vital Signs BP  137/87 (BP Location: Right Arm)   Pulse 100   Temp 98.4 F (36.9 C) (Oral)   Resp 18   Ht 5\' 5"  (1.651 m)   Wt 107.5 kg   SpO2 100%   BMI 39.44 kg/m   Physical Exam Vitals signs and nursing note reviewed.  Constitutional:      Appearance: She is well-developed.  HENT:     Head: Normocephalic.     Comments: Posterior pharynx is normal.  Uvula midline.  Tolerating secretions.  No stridor.  Oral airway patent. Neck:     Musculoskeletal: Normal range of motion.  Pulmonary:     Effort: Pulmonary effort is normal.  Abdominal:     General: There is no distension.  Musculoskeletal: Normal range of motion.  Skin:    Comments: Urticaria of the arms anterior abdomen and thighs.  No significant facial swelling or  urticaria noted on the face  Neurological:     Mental Status: She is alert and oriented to person, place, and time.      ED Treatments / Results  Labs (all labs ordered are listed, but only abnormal results are displayed) Labs Reviewed - No data to display  EKG None  Radiology No results found.  Procedures Procedures (including critical care time)  Medications Ordered in ED Medications  famotidine (PEPCID) tablet 20 mg (20 mg Oral Given 02/08/19 2240)  predniSONE (DELTASONE) tablet 60 mg (60 mg Oral Given 02/08/19 2240)     Initial Impression / Assessment and Plan / ED Course  I have reviewed the triage vital signs and the nursing notes.  Pertinent labs & imaging results that were available during my care of the patient were reviewed by me and considered in my medical decision making (see chart for details).        Urticaria.  No clear etiology.  Could represent the new detergent.  Patient will be discharged home with Pepcid and prednisone as well as scheduled Benadryl.  She understands to return to the ER for new or worsening symptoms.  No difficulty breathing or swallowing to suggest airway compromise or anaphylaxis.  Final Clinical Impressions(s) / ED Diagnoses   Final diagnoses:  Urticaria    ED Discharge Orders         Ordered    famotidine (PEPCID) 20 MG tablet  2 times daily     02/08/19 2236    diphenhydrAMINE (BENADRYL) 25 MG tablet  Every 6 hours     02/08/19 2236    predniSONE (DELTASONE) 20 MG tablet  Daily     02/08/19 2236           Azalia Bilis, MD 02/08/19 2333

## 2019-02-08 NOTE — ED Triage Notes (Signed)
Reports hives going all over possibly from switching laundry detergent.  Took benadryl 25mg  at 4pm then again at 10pm with no relief to the itching.

## 2019-04-14 ENCOUNTER — Emergency Department (HOSPITAL_BASED_OUTPATIENT_CLINIC_OR_DEPARTMENT_OTHER)
Admission: EM | Admit: 2019-04-14 | Discharge: 2019-04-14 | Discharge status: Against Medical Advice | Payer: 59 | Attending: Emergency Medicine | Admitting: Emergency Medicine

## 2019-04-14 ENCOUNTER — Encounter (HOSPITAL_BASED_OUTPATIENT_CLINIC_OR_DEPARTMENT_OTHER): Payer: Self-pay

## 2019-04-14 ENCOUNTER — Other Ambulatory Visit: Payer: Self-pay

## 2019-04-14 DIAGNOSIS — R109 Unspecified abdominal pain: Secondary | ICD-10-CM | POA: Diagnosis present

## 2019-04-14 DIAGNOSIS — Z5321 Procedure and treatment not carried out due to patient leaving prior to being seen by health care provider: Secondary | ICD-10-CM | POA: Diagnosis not present

## 2019-04-14 LAB — URINALYSIS, ROUTINE W REFLEX MICROSCOPIC
Bilirubin Urine: NEGATIVE
Glucose, UA: NEGATIVE mg/dL
Hgb urine dipstick: NEGATIVE
Ketones, ur: NEGATIVE mg/dL
Leukocytes,Ua: NEGATIVE
Nitrite: NEGATIVE
Protein, ur: NEGATIVE mg/dL
Specific Gravity, Urine: >1.03 — ABNORMAL HIGH (ref 1.005–1.030)
pH: 6 (ref 5.0–8.0)

## 2019-04-14 LAB — PREGNANCY, URINE: Preg Test, Ur: NEGATIVE

## 2019-04-14 NOTE — ED Triage Notes (Signed)
Pt c/o abd pain x 1 week-denies n/v/d, urinary sx and vaginal d/c-NAD-steady gait

## 2019-06-09 ENCOUNTER — Encounter (HOSPITAL_BASED_OUTPATIENT_CLINIC_OR_DEPARTMENT_OTHER): Payer: Self-pay | Admitting: Emergency Medicine

## 2019-06-09 ENCOUNTER — Emergency Department (HOSPITAL_BASED_OUTPATIENT_CLINIC_OR_DEPARTMENT_OTHER): Payer: 59

## 2019-06-09 ENCOUNTER — Other Ambulatory Visit: Payer: Self-pay

## 2019-06-09 ENCOUNTER — Emergency Department (HOSPITAL_BASED_OUTPATIENT_CLINIC_OR_DEPARTMENT_OTHER)
Admission: EM | Admit: 2019-06-09 | Discharge: 2019-06-09 | Disposition: A | Discharge status: Home / Self Care | Payer: 59 | Attending: Emergency Medicine | Admitting: Emergency Medicine

## 2019-06-09 DIAGNOSIS — M5430 Sciatica, unspecified side: Secondary | ICD-10-CM | POA: Diagnosis not present

## 2019-06-09 DIAGNOSIS — I10 Essential (primary) hypertension: Secondary | ICD-10-CM | POA: Insufficient documentation

## 2019-06-09 DIAGNOSIS — Z79899 Other long term (current) drug therapy: Secondary | ICD-10-CM | POA: Insufficient documentation

## 2019-06-09 DIAGNOSIS — M545 Low back pain: Secondary | ICD-10-CM | POA: Diagnosis present

## 2019-06-09 DIAGNOSIS — M79604 Pain in right leg: Secondary | ICD-10-CM | POA: Insufficient documentation

## 2019-06-09 DIAGNOSIS — Z87891 Personal history of nicotine dependence: Secondary | ICD-10-CM | POA: Insufficient documentation

## 2019-06-09 LAB — URINALYSIS, ROUTINE W REFLEX MICROSCOPIC
Bilirubin Urine: NEGATIVE
Glucose, UA: NEGATIVE mg/dL
Hgb urine dipstick: NEGATIVE
Ketones, ur: NEGATIVE mg/dL
Leukocytes,Ua: NEGATIVE
Nitrite: NEGATIVE
Protein, ur: NEGATIVE mg/dL
Specific Gravity, Urine: >1.03 — ABNORMAL HIGH (ref 1.005–1.030)
pH: 6 (ref 5.0–8.0)

## 2019-06-09 LAB — PREGNANCY, URINE: Preg Test, Ur: NEGATIVE

## 2019-06-09 MED ORDER — PREDNISONE 10 MG (21) PO TBPK: ORAL_TABLET | Freq: Every day | ORAL | 0 refills | Status: DC

## 2019-06-09 NOTE — ED Triage Notes (Signed)
R low back pain for over a month. Has been seen by PCP. Also c/o R lower leg pain since yesterday.

## 2019-06-09 NOTE — Discharge Instructions (Addendum)
Please follow up with your primary care provider within 5-7 days for re-evaluation of your symptoms. If you do not have a primary care provider, information for a healthcare clinic has been provided for you to make arrangements for follow up care.   Return to the emergency department immediately if you experience any back pain associated with fevers, loss of control of your bowels/bladder, weakness/numbness to your legs, numbness to your groin area, inability to walk, or inability to urinate.

## 2019-06-09 NOTE — ED Provider Notes (Signed)
[facility] EMERGENCY DEPARTMENT Provider Note   CSN: [id number] Arrival date & time: 06/09/19  1249     History   Chief Complaint Chief Complaint  Patient presents with   Back Pain   Leg Pain    HPI Virginia Wagner is a 33 y.o. female.     HPI   Pt is a 33 y/o female who presents to the ED today for eval of multiple complaints including right leg pain and right right lower back.    Right lower leg: States she has pain to the right lower leg along the shin that started 2 days ago. She states she works on her feet for many hours. Pain rated 3/10. It has been constant. Denies exacerbating symptoms. States that ibuprofen seems to help symptoms. Denies leg swelling. Denies leg pain/swelling, hemoptysis, recent surgery/trauma, recent long travel, hormone use, personal hx of cancer, or hx of DVT/PE. She is worried about a blood clot.   Right lower back pain: She also c/o pain to right lower back that has been present for the last week. She has a h/o sciatica and states this feels similar. Pain rated 5/10. Pain is intermittent and seems to be exacerbated by certain movements and positions. Pain does not radiate. Ibuprofen seems to help with this pain as well. Pt denies any numbness/tingling/weakness to the BLE. Denies saddle anesthesia. Denies loss of control of bowels or bladder. No urinary retention. No fevers. Denies a h/o IVDU. Denies a h/o CA or recent unintended weight loss.  Past Medical History:  Diagnosis Date   Hypertension     There are no active problems to display for this patient.   Past Surgical History:  Procedure Laterality Date   HERNIA REPAIR     TONSILLECTOMY       OB History   No obstetric history on file.      Home Medications    Prior to Admission medications   Medication Sig Start Date End Date Taking? Authorizing Provider  albuterol (PROVENTIL HFA;VENTOLIN HFA) 108 (90 Base) MCG/ACT inhaler Inhale 1-2 puffs into the lungs every 6  (six) hours as needed for wheezing or shortness of breath. 10/04/16   Rise Mu, PA-C  benzonatate (TESSALON) 100 MG capsule Take 1 capsule (100 mg total) by mouth every 8 (eight) hours. 10/04/16   Rise Mu, PA-C  diphenhydrAMINE (BENADRYL) 25 MG tablet Take 2 tablets (50 mg total) by mouth every 6 (six) hours for 3 days. 02/08/19 02/11/19  Azalia Bilis, MD  famotidine (PEPCID) 20 MG tablet Take 1 tablet (20 mg total) by mouth 2 (two) times daily. 02/08/19   Azalia Bilis, MD  HYDROcodone-acetaminophen (NORCO/VICODIN) 5-325 MG tablet Take 1 tablet by mouth every 4 (four) hours as needed for severe pain. 06/09/16   Sam, Ace Gins, PA-C  ibuprofen (ADVIL,MOTRIN) 600 MG tablet Take 1 tablet (600 mg total) by mouth every 6 (six) hours as needed. 01/24/17   Jacalyn Lefevre, MD  loratadine (CLARITIN) 10 MG tablet Take 1 tablet (10 mg total) by mouth daily. 01/24/17   Jacalyn Lefevre, MD  methocarbamol (ROBAXIN) 500 MG tablet Take 1 tablet (500 mg total) by mouth 2 (two) times daily. 06/09/16   Sam, Ace Gins, PA-C  naproxen (NAPROSYN) 500 MG tablet Take 1 tablet (500 mg total) by mouth 2 (two) times daily. 06/09/16   Sam, Ace Gins, PA-C  predniSONE (STERAPRED UNI-PAK 21 TAB) 10 MG (21) TBPK tablet Take by mouth daily. Take 6 tabs by mouth daily  for 2 days, then 5 tabs for 2 days, then 4 tabs for 2 days, then 3 tabs for 2 days, 2 tabs for 2 days, then 1 tab by mouth daily for 2 days 06/09/19   Hallis Meditz S, PA-C    Family History No family history on file.  Social History Social History   Tobacco Use   Smoking status: Former Smoker    Packs/day: 0.50    Types: Cigarettes    Quit date: 2017    Years since quitting: 3.7   Smokeless tobacco: Never Used  Substance Use Topics   Alcohol use: Yes    Comment: rare   Drug use: No     Allergies   Patient has no known allergies.   Review of Systems Review of Systems  Constitutional: Negative for fever.  HENT: Negative for sore  throat.   Eyes: Negative for visual disturbance.  Respiratory: Negative for cough and shortness of breath.   Cardiovascular: Negative for chest pain and leg swelling.  Gastrointestinal: Negative for abdominal pain, constipation, diarrhea, nausea and vomiting.  Genitourinary: Negative for dysuria, hematuria and urgency.  Musculoskeletal: Positive for back pain.       Right leg pain  Skin: Negative for color change and rash.  Neurological: Negative for weakness and numbness.  All other systems reviewed and are negative.    Physical Exam Updated Vital Signs BP (!) 131/96 (BP Location: Right Arm)    Pulse 74    Temp 99.1 F (37.3 C) (Oral)    Resp 14    Ht 5\' 6"  (1.676 m)    Wt 107.5 kg    SpO2 100%    BMI 38.25 kg/m   Physical Exam Vitals signs and nursing note reviewed.  Constitutional:      General: She is not in acute distress.    Appearance: She is well-developed.  HENT:     Head: Normocephalic and atraumatic.  Eyes:     Conjunctiva/sclera: Conjunctivae normal.  Neck:     Musculoskeletal: Neck supple.  Cardiovascular:     Rate and Rhythm: Normal rate and regular rhythm.     Heart sounds: No murmur.  Pulmonary:     Effort: Pulmonary effort is normal. No respiratory distress.     Breath sounds: Normal breath sounds.  Abdominal:     Palpations: Abdomen is soft.     Tenderness: There is no abdominal tenderness.  Musculoskeletal:     Comments: No midline TTP. TTP to the right sciatic notch that reproduces pain. TTP to the right lumbar and thoracic paraspinous muscles. TTP to the right shin. No calf TTP. No LE edema or erythema   Skin:    General: Skin is warm and dry.  Neurological:     Mental Status: She is alert.      ED Treatments / Results  Labs (all labs ordered are listed, but only abnormal results are displayed) Labs Reviewed  URINALYSIS, ROUTINE W REFLEX MICROSCOPIC - Abnormal; Notable for the following components:      Result Value   Specific Gravity,  Urine >1.030 (*)    All other components within normal limits  PREGNANCY, URINE    EKG None  Radiology [place] Venous Img Lower Right (dvt Study)  Result Date: 06/09/2019 CLINICAL DATA:  Right lower extremity pain EXAM: RIGHT LOWER EXTREMITY VENOUS DOPPLER ULTRASOUND TECHNIQUE: Gray-scale sonography with graded compression, as well as color Doppler and duplex ultrasound were performed to evaluate the lower extremity deep venous systems from the level  of the common femoral vein and including the common femoral, femoral, profunda femoral, popliteal and calf veins including the posterior tibial, peroneal and gastrocnemius veins when visible. The superficial great saphenous vein was also interrogated. Spectral Doppler was utilized to evaluate flow at rest and with distal augmentation maneuvers in the common femoral, femoral and popliteal veins. COMPARISON:  None. FINDINGS: Contralateral Common Femoral Vein: Respiratory phasicity is normal and symmetric with the symptomatic side. No evidence of thrombus. Normal compressibility. Common Femoral Vein: No evidence of thrombus. Normal compressibility, respiratory phasicity and response to augmentation. Saphenofemoral Junction: No evidence of thrombus. Normal compressibility and flow on color Doppler imaging. Profunda Femoral Vein: No evidence of thrombus. Normal compressibility and flow on color Doppler imaging. Femoral Vein: No evidence of thrombus. Normal compressibility, respiratory phasicity and response to augmentation. Popliteal Vein: No evidence of thrombus. Normal compressibility, respiratory phasicity and response to augmentation. Calf Veins: No evidence of thrombus. Normal compressibility and flow on color Doppler imaging. Superficial Great Saphenous Vein: No evidence of thrombus. Normal compressibility. Venous Reflux:  Not assessed Other Findings:  None. IMPRESSION: No evidence of deep venous thrombosis. Electronically Signed   By: Jerilynn Mages.  Shick M.D.   On:  06/09/2019 16:17    Procedures Procedures (including critical care time)  Medications Ordered in ED Medications - No data to display   Initial Impression / Assessment and Plan / ED Course  I have reviewed the triage vital signs and the nursing notes.  Pertinent labs & imaging results that were available during my care of the patient were reviewed by me and considered in my medical decision making (see chart for details).       Final Clinical Impressions(s) / ED Diagnoses   Final diagnoses:  Sciatica, unspecified laterality  Right leg pain    Pt is a 33 y/o female who presents to the ED today for eval of multiple complaints including right leg pain and right right lower back.    Right lower leg: Pt with TTP to the shin. No calf ttp or risk factors for VTE, however pt very concerned for this. Exam is very reassuring and suggests MSK cause such as shin splint. LE [place] is negative for DVT.   Right lower back: TTP to the right sciatic notch and feels similar to hx of sciatica. No red flag signs sxs. Exam reassuring.   Will give prednisone taper and have pt f/u with pcp. Advised to return if worse. She voices understanding and is in agreement with plan. All questions answered, pt stable for d.c    ED Discharge Orders         Ordered    predniSONE (STERAPRED UNI-PAK 21 TAB) 10 MG (21) TBPK tablet  Daily     06/09/19 1647           Rodney Booze, PA-C 06/09/19 1652    Sherwood Gambler, MD 06/12/19 [phone]

## 2020-02-28 ENCOUNTER — Other Ambulatory Visit: Payer: Self-pay

## 2020-02-28 ENCOUNTER — Encounter (HOSPITAL_BASED_OUTPATIENT_CLINIC_OR_DEPARTMENT_OTHER): Payer: Self-pay | Admitting: Emergency Medicine

## 2020-02-28 ENCOUNTER — Emergency Department (HOSPITAL_BASED_OUTPATIENT_CLINIC_OR_DEPARTMENT_OTHER)
Admission: EM | Admit: 2020-02-28 | Discharge: 2020-02-28 | Disposition: A | Discharge status: Home / Self Care | Payer: Self-pay | Attending: Emergency Medicine | Admitting: Emergency Medicine

## 2020-02-28 ENCOUNTER — Emergency Department (HOSPITAL_BASED_OUTPATIENT_CLINIC_OR_DEPARTMENT_OTHER): Payer: Self-pay

## 2020-02-28 DIAGNOSIS — M79604 Pain in right leg: Secondary | ICD-10-CM | POA: Insufficient documentation

## 2020-02-28 DIAGNOSIS — R0789 Other chest pain: Secondary | ICD-10-CM | POA: Insufficient documentation

## 2020-02-28 DIAGNOSIS — Z79899 Other long term (current) drug therapy: Secondary | ICD-10-CM | POA: Insufficient documentation

## 2020-02-28 DIAGNOSIS — I10 Essential (primary) hypertension: Secondary | ICD-10-CM | POA: Insufficient documentation

## 2020-02-28 DIAGNOSIS — Z87891 Personal history of nicotine dependence: Secondary | ICD-10-CM | POA: Insufficient documentation

## 2020-02-28 LAB — BASIC METABOLIC PANEL
Anion gap: 9 (ref 5–15)
BUN: 14 mg/dL (ref 6–20)
CO2: 26 mmol/L (ref 22–32)
Calcium: 9.2 mg/dL (ref 8.9–10.3)
Chloride: 103 mmol/L (ref 98–111)
Creatinine, Ser: 0.82 mg/dL (ref 0.44–1.00)
GFR calc Af Amer: >60 mL/min (ref >60)
GFR calc non Af Amer: >60 mL/min (ref >60)
Glucose, Bld: 109 mg/dL — ABNORMAL HIGH (ref 70–99)
Potassium: 3.6 mmol/L (ref 3.5–5.1)
Sodium: 138 mmol/L (ref 135–145)

## 2020-02-28 LAB — CBC WITH DIFFERENTIAL/PLATELET
Abs Immature Granulocytes: 0.01 10*3/uL (ref 0.00–0.07)
Basophils Absolute: 0 10*3/uL (ref 0.0–0.1)
Basophils Relative: 1 %
Eosinophils Absolute: 0.1 10*3/uL (ref 0.0–0.5)
Eosinophils Relative: 2 %
HCT: 38.6 % (ref 36.0–46.0)
Hemoglobin: 13.4 g/dL (ref 12.0–15.0)
Immature Granulocytes: 0 %
Lymphocytes Relative: 39 %
Lymphs Abs: 1.7 10*3/uL (ref 0.7–4.0)
MCH: 31.5 pg (ref 26.0–34.0)
MCHC: 34.7 g/dL (ref 30.0–36.0)
MCV: 90.6 fL (ref 80.0–100.0)
Monocytes Absolute: 0.6 10*3/uL (ref 0.1–1.0)
Monocytes Relative: 12 %
Neutro Abs: 2.1 10*3/uL (ref 1.7–7.7)
Neutrophils Relative %: 46 %
Platelets: 238 10*3/uL (ref 150–400)
RBC: 4.26 MIL/uL (ref 3.87–5.11)
RDW: 12.3 % (ref 11.5–15.5)
WBC: 4.5 10*3/uL (ref 4.0–10.5)
nRBC: 0 % (ref 0.0–0.2)

## 2020-02-28 LAB — D-DIMER, QUANTITATIVE: D-Dimer, Quant: <0.27 ug/mL-FEU (ref 0.00–0.50)

## 2020-02-28 MED ORDER — CYCLOBENZAPRINE HCL 10 MG PO TABS: 10.0000 mg | ORAL_TABLET | Freq: Two times a day (BID) | ORAL | 0 refills | Status: DC | PRN

## 2020-02-28 MED ORDER — KETOROLAC TROMETHAMINE 30 MG/ML IJ SOLN
30.0000 mg | Freq: Once | INTRAMUSCULAR | Status: AC
  Administered 2020-02-28: 30 mg via INTRAVENOUS
  Filled 2020-02-28: qty 1

## 2020-02-28 NOTE — Discharge Instructions (Signed)
Please read instructions below. Apply heat to your areas of pain. You can take 600mg  of ibuprofen and alternate with 500-650mg  of tylenol every 4 hours. You can take Flexeril every 12 hours as needed for muscle spasm.  Be aware this medication can make you drowsy. Schedule an appointment with your primary care. Return to the ER for new or concerning symptoms.

## 2020-02-28 NOTE — ED Triage Notes (Signed)
Reports having multiple body aches that started 2 days ago.  Pointing out right ribcage, lower back, shoulders, and right leg pain.  Denies having any other symptoms.

## 2020-02-28 NOTE — ED Provider Notes (Signed)
[facility] EMERGENCY DEPARTMENT Provider Note   CSN: [id number] Arrival date & time: 02/28/20  1509     History Chief Complaint  Patient presents with  . Generalized Body Aches    Virginia Wagner is a 34 y.o. female with past medical history of hypertension, presenting to the emergency department with complaint of multiple areas of pain.  She states she has been having intermittent pain to her right lower ribs that has been coming and going for some time.  She states however she began developing pain left side of her ribs under her arm as well as left upper chest and in her left trapezius muscle group.  It feels like a tightness discomfort.  Is worse with palpation and movement of her arms.  It is not worse with breathing or does she have associated cough or shortness of breath.  She does also report some intermittent shooting pains to her right lateral calf that come and go.  It is not worse with standing or movement.  It is not reproducible and she touches her leg.  She denies associated swelling. No history of DVT or PE, no exogenous estrogen use.  No recent immobilization or surgery.  She did not use tobacco currently.  No history of COVID-19 illness.  The history is provided by the patient.       Past Medical History:  Diagnosis Date  . Hypertension     There are no problems to display for this patient.   Past Surgical History:  Procedure Laterality Date  . HERNIA REPAIR    . TONSILLECTOMY       OB History   No obstetric history on file.     No family history on file.  Social History   Tobacco Use  . Smoking status: Former Smoker    Packs/day: 0.50    Types: Cigarettes    Quit date: 2017    Years since quitting: 4.4  . Smokeless tobacco: Never Used  Substance Use Topics  . Alcohol use: Yes    Comment: rare  . Drug use: No    Home Medications Prior to Admission medications   Medication Sig Start Date End Date Taking? Authorizing Provider    amLODipine (NORVASC) 2.5 MG tablet Take 2.5 mg by mouth daily.   Yes [provider]  albuterol (PROVENTIL HFA;VENTOLIN HFA) 108 (90 Base) MCG/ACT inhaler Inhale 1-2 puffs into the lungs every 6 (six) hours as needed for wheezing or shortness of breath. 10/04/16   Rise Mu, PA-C  benzonatate (TESSALON) 100 MG capsule Take 1 capsule (100 mg total) by mouth every 8 (eight) hours. 10/04/16   Rise Mu, PA-C  cyclobenzaprine (FLEXERIL) 10 MG tablet Take 1 tablet (10 mg total) by mouth 2 (two) times daily as needed for muscle spasms. 02/28/20   Kinser Fellman, [place] N, PA-C  diphenhydrAMINE (BENADRYL) 25 MG tablet Take 2 tablets (50 mg total) by mouth every 6 (six) hours for 3 days. 02/08/19 02/11/19  Azalia Bilis, MD  famotidine (PEPCID) 20 MG tablet Take 1 tablet (20 mg total) by mouth 2 (two) times daily. 02/08/19   Azalia Bilis, MD  HYDROcodone-acetaminophen (NORCO/VICODIN) 5-325 MG tablet Take 1 tablet by mouth every 4 (four) hours as needed for severe pain. 06/09/16   Sam, Ace Gins, PA-C  ibuprofen (ADVIL,MOTRIN) 600 MG tablet Take 1 tablet (600 mg total) by mouth every 6 (six) hours as needed. 01/24/17   Jacalyn Lefevre, MD  loratadine (CLARITIN) 10 MG tablet Take 1  tablet (10 mg total) by mouth daily. 01/24/17   Isla Pence, MD  methocarbamol (ROBAXIN) 500 MG tablet Take 1 tablet (500 mg total) by mouth 2 (two) times daily. 06/09/16   Sam, Olivia Canter, PA-C  naproxen (NAPROSYN) 500 MG tablet Take 1 tablet (500 mg total) by mouth 2 (two) times daily. 06/09/16   Sam, Olivia Canter, PA-C  predniSONE (STERAPRED UNI-PAK 21 TAB) 10 MG (21) TBPK tablet Take by mouth daily. Take 6 tabs by mouth daily  for 2 days, then 5 tabs for 2 days, then 4 tabs for 2 days, then 3 tabs for 2 days, 2 tabs for 2 days, then 1 tab by mouth daily for 2 days 06/09/19   Couture, Cortni S, PA-C    Allergies    Patient has no known allergies.  Review of Systems   Review of Systems  All other systems reviewed and  are negative.   Physical Exam Updated Vital Signs BP (!) 147/92 (BP Location: Right Arm)   Pulse 97   Temp 98.3 F (36.8 C) (Oral)   Resp 18   Ht 5\' 2"  (1.575 m)   Wt 102.5 kg   LMP 02/02/2020   SpO2 100%   BMI 41.34 kg/m   Physical Exam Vitals and nursing note reviewed.  Constitutional:      General: She is not in acute distress.    Appearance: She is well-developed. She is not ill-appearing.  HENT:     Head: Normocephalic and atraumatic.  Eyes:     Conjunctiva/sclera: Conjunctivae normal.  Cardiovascular:     Rate and Rhythm: Normal rate and regular rhythm.  Pulmonary:     Effort: Pulmonary effort is normal. No respiratory distress.     Breath sounds: Normal breath sounds.       Comments: Movement of arms causes pain in chest. Chest:     Chest wall: Tenderness present.    Abdominal:     General: Bowel sounds are normal.     Palpations: Abdomen is soft.     Tenderness: There is no abdominal tenderness. There is no guarding or rebound.  Musculoskeletal:     Comments: Right leg without swelling, redness, warmth or tenderness. Neg homans sign. Normal sensation and DP pulse.  Skin:    General: Skin is warm.  Neurological:     Mental Status: She is alert.  Psychiatric:        Behavior: Behavior normal.     ED Results / Procedures / Treatments   Labs (all labs ordered are listed, but only abnormal results are displayed) Labs Reviewed  BASIC METABOLIC PANEL - Abnormal; Notable for the following components:      Result Value   Glucose, Bld 109 (*)    All other components within normal limits  CBC WITH DIFFERENTIAL/PLATELET  D-DIMER, QUANTITATIVE (NOT AT [facility])    EKG None  Radiology DG Chest 2 View  Result Date: 02/28/2020 CLINICAL DATA:  Body aches a began 2 days ago, thoracolumbar cramping EXAM: CHEST - 2 VIEW COMPARISON:  12/05/2018 FINDINGS: The heart size and mediastinal contours are within normal limits. Both lungs are clear. The visualized skeletal  structures are unremarkable. IMPRESSION: No active cardiopulmonary disease. Electronically Signed   By: Randa Ngo M.D.   On: 02/28/2020 16:47    Procedures Procedures (including critical care time)  Medications Ordered in ED Medications  ketorolac (TORADOL) 30 MG/ML injection 30 mg (has no administration in time range)    ED Course  I have reviewed  the triage vital signs and the nursing notes.  Pertinent labs & imaging results that were available during my care of the patient were reviewed by me and considered in my medical decision making (see chart for details).    MDM Rules/Calculators/A&P                      Patient presenting with multiple areas of pain for the last few days.  She endorses pain to her right calf lateral aspect that comes and goes, as well as pain to her bilateral ribs.  Pain is worse with movement and palpation, no shortness of breath or pleuritic pain.  No swelling to her extremities.  No risk for PE however given patient's presentation laboratory evaluation was done including D-dimer.  Labs are very reassuring, dimer is negative.  Chest x-ray is also negative.  Suspect this may be musculoskeletal in nature given reproducible symptoms with palpation and movement.  No skin changes to suggest early shingles.  No infectious symptoms.  Recommend symptomatic management at this time and close outpatient follow-up.  Return precautions discussed.  Patient is well-appearing, agreeable plan, safe for discharge.  Discussed results, findings, treatment and follow up. Patient advised of return precautions. Patient verbalized understanding and agreed with plan.  Final Clinical Impression(s) / ED Diagnoses Final diagnoses:  Chest wall pain  Right leg pain    Rx / DC Orders ED Discharge Orders         Ordered    cyclobenzaprine (FLEXERIL) 10 MG tablet  2 times daily PRN     02/28/20 1809           Jaquille Kau, [place] N, PA-C 02/28/20 1813    Rolan Bucco,  MD 02/28/20 2038

## 2020-06-18 ENCOUNTER — Emergency Department (HOSPITAL_BASED_OUTPATIENT_CLINIC_OR_DEPARTMENT_OTHER): Payer: Medicaid Other

## 2020-06-18 ENCOUNTER — Other Ambulatory Visit: Payer: Self-pay

## 2020-06-18 ENCOUNTER — Encounter (HOSPITAL_BASED_OUTPATIENT_CLINIC_OR_DEPARTMENT_OTHER): Payer: Self-pay | Admitting: Emergency Medicine

## 2020-06-18 ENCOUNTER — Emergency Department (HOSPITAL_BASED_OUTPATIENT_CLINIC_OR_DEPARTMENT_OTHER)
Admission: EM | Admit: 2020-06-18 | Discharge: 2020-06-18 | Disposition: A | Discharge status: Home / Self Care | Payer: Medicaid Other | Attending: Emergency Medicine | Admitting: Emergency Medicine

## 2020-06-18 DIAGNOSIS — I10 Essential (primary) hypertension: Secondary | ICD-10-CM | POA: Insufficient documentation

## 2020-06-18 DIAGNOSIS — R1011 Right upper quadrant pain: Secondary | ICD-10-CM | POA: Diagnosis not present

## 2020-06-18 DIAGNOSIS — Z79899 Other long term (current) drug therapy: Secondary | ICD-10-CM | POA: Insufficient documentation

## 2020-06-18 DIAGNOSIS — R079 Chest pain, unspecified: Secondary | ICD-10-CM | POA: Diagnosis present

## 2020-06-18 DIAGNOSIS — R0789 Other chest pain: Secondary | ICD-10-CM | POA: Diagnosis not present

## 2020-06-18 DIAGNOSIS — Z87891 Personal history of nicotine dependence: Secondary | ICD-10-CM | POA: Insufficient documentation

## 2020-06-18 DIAGNOSIS — R52 Pain, unspecified: Secondary | ICD-10-CM

## 2020-06-18 DIAGNOSIS — R1010 Upper abdominal pain, unspecified: Secondary | ICD-10-CM

## 2020-06-18 LAB — CBC WITH DIFFERENTIAL/PLATELET
Abs Immature Granulocytes: 0.02 10*3/uL (ref 0.00–0.07)
Basophils Absolute: 0.1 10*3/uL (ref 0.0–0.1)
Basophils Relative: 1 %
Eosinophils Absolute: 0.1 10*3/uL (ref 0.0–0.5)
Eosinophils Relative: 2 %
HCT: 40 % (ref 36.0–46.0)
Hemoglobin: 13.7 g/dL (ref 12.0–15.0)
Immature Granulocytes: 0 %
Lymphocytes Relative: 31 %
Lymphs Abs: 1.5 10*3/uL (ref 0.7–4.0)
MCH: 31.3 pg (ref 26.0–34.0)
MCHC: 34.3 g/dL (ref 30.0–36.0)
MCV: 91.3 fL (ref 80.0–100.0)
Monocytes Absolute: 0.5 10*3/uL (ref 0.1–1.0)
Monocytes Relative: 10 %
Neutro Abs: 2.6 10*3/uL (ref 1.7–7.7)
Neutrophils Relative %: 56 %
Platelets: 266 10*3/uL (ref 150–400)
RBC: 4.38 MIL/uL (ref 3.87–5.11)
RDW: 12.4 % (ref 11.5–15.5)
WBC: 4.7 10*3/uL (ref 4.0–10.5)
nRBC: 0 % (ref 0.0–0.2)

## 2020-06-18 LAB — COMPREHENSIVE METABOLIC PANEL
ALT: 18 U/L (ref 0–44)
AST: 17 U/L (ref 15–41)
Albumin: 4.5 g/dL (ref 3.5–5.0)
Alkaline Phosphatase: 34 U/L — ABNORMAL LOW (ref 38–126)
Anion gap: 10 (ref 5–15)
BUN: 19 mg/dL (ref 6–20)
CO2: 24 mmol/L (ref 22–32)
Calcium: 9.2 mg/dL (ref 8.9–10.3)
Chloride: 103 mmol/L (ref 98–111)
Creatinine, Ser: 0.75 mg/dL (ref 0.44–1.00)
GFR calc Af Amer: >60 mL/min (ref >60)
GFR calc non Af Amer: >60 mL/min (ref >60)
Glucose, Bld: 94 mg/dL (ref 70–99)
Potassium: 3.6 mmol/L (ref 3.5–5.1)
Sodium: 137 mmol/L (ref 135–145)
Total Bilirubin: 0.9 mg/dL (ref 0.3–1.2)
Total Protein: 8.3 g/dL — ABNORMAL HIGH (ref 6.5–8.1)

## 2020-06-18 LAB — LIPASE, BLOOD: Lipase: 22 U/L (ref 11–51)

## 2020-06-18 LAB — URINALYSIS, ROUTINE W REFLEX MICROSCOPIC
Bilirubin Urine: NEGATIVE
Glucose, UA: NEGATIVE mg/dL
Ketones, ur: NEGATIVE mg/dL
Nitrite: NEGATIVE
Protein, ur: NEGATIVE mg/dL
Specific Gravity, Urine: 1.015 (ref 1.005–1.030)
pH: 6.5 (ref 5.0–8.0)

## 2020-06-18 LAB — URINALYSIS, MICROSCOPIC (REFLEX)

## 2020-06-18 LAB — PREGNANCY, URINE: Preg Test, Ur: NEGATIVE

## 2020-06-18 MED ORDER — FAMOTIDINE 20 MG PO TABS: 20.0000 mg | ORAL_TABLET | Freq: Every day | ORAL | 0 refills | Status: AC

## 2020-06-18 MED ORDER — SODIUM CHLORIDE 0.9 % IV BOLUS
1000.0000 mL | Freq: Once | INTRAVENOUS | Status: AC
  Administered 2020-06-18: 1000 mL via INTRAVENOUS

## 2020-06-18 NOTE — ED Provider Notes (Signed)
[facility] EMERGENCY DEPARTMENT Provider Note   CSN: [id number] Arrival date & time: 06/18/20  5102     History Chief Complaint  Patient presents with  . Chest Pain  . Abdominal Pain    Virginia Wagner is a 34 y.o. female.  Pt presents to the ED today with cp and abdominal pain and elevated bp.  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  The pt said she was there this am and did not feel well.  They checked her bp and it was elevated.  She said she's been having RUQ and right flank pain for the past few months.  She denies any f/c.  No n/v.        Past Medical History:  Diagnosis Date  . Hypertension     There are no problems to display for this patient.   Past Surgical History:  Procedure Laterality Date  . HERNIA REPAIR    . TONSILLECTOMY       OB History   No obstetric history on file.     History reviewed. No pertinent family history.  Social History   Tobacco Use  . Smoking status: Former Smoker    Packs/day: 0.50    Types: Cigarettes    Quit date: 2017    Years since quitting: 4.7  . Smokeless tobacco: Never Used  Vaping Use  . Vaping Use: Never used  Substance Use Topics  . Alcohol use: Yes    Comment: rare  . Drug use: No    Home Medications Prior to Admission medications   Medication Sig Start Date End Date Taking? Authorizing Provider  albuterol (PROVENTIL HFA;VENTOLIN HFA) 108 (90 Base) MCG/ACT inhaler Inhale 1-2 puffs into the lungs every 6 (six) hours as needed for wheezing or shortness of breath. 10/04/16   Rise Mu, PA-C  amLODipine (NORVASC) 2.5 MG tablet Take 2.5 mg by mouth daily.    [provider]  benzonatate (TESSALON) 100 MG capsule Take 1 capsule (100 mg total) by mouth every 8 (eight) hours. 10/04/16   Rise Mu, PA-C  cyclobenzaprine (FLEXERIL) 10 MG tablet Take 1 tablet (10 mg total) by mouth 2 (two) times daily as needed for muscle spasms. 02/28/20   Robinson, [place] N, PA-C    diphenhydrAMINE (BENADRYL) 25 MG tablet Take 2 tablets (50 mg total) by mouth every 6 (six) hours for 3 days. 02/08/19 02/11/19  Azalia Bilis, MD  famotidine (PEPCID) 20 MG tablet Take 1 tablet (20 mg total) by mouth daily. 06/18/20   Jacalyn Lefevre, MD  HYDROcodone-acetaminophen (NORCO/VICODIN) 5-325 MG tablet Take 1 tablet by mouth every 4 (four) hours as needed for severe pain. 06/09/16   Sam, Ace Gins, PA-C  ibuprofen (ADVIL,MOTRIN) 600 MG tablet Take 1 tablet (600 mg total) by mouth every 6 (six) hours as needed. 01/24/17   Jacalyn Lefevre, MD  loratadine (CLARITIN) 10 MG tablet Take 1 tablet (10 mg total) by mouth daily. 01/24/17   Jacalyn Lefevre, MD  methocarbamol (ROBAXIN) 500 MG tablet Take 1 tablet (500 mg total) by mouth 2 (two) times daily. 06/09/16   Sam, Ace Gins, PA-C  naproxen (NAPROSYN) 500 MG tablet Take 1 tablet (500 mg total) by mouth 2 (two) times daily. 06/09/16   Sam, Ace Gins, PA-C  predniSONE (STERAPRED UNI-PAK 21 TAB) 10 MG (21) TBPK tablet Take by mouth daily. Take 6 tabs by mouth daily  for 2 days, then 5 tabs for 2 days, then 4 tabs for 2  days, then 3 tabs for 2 days, 2 tabs for 2 days, then 1 tab by mouth daily for 2 days 06/09/19   Couture, Cortni S, PA-C    Allergies    Patient has no known allergies.  Review of Systems   Review of Systems  Gastrointestinal: Positive for abdominal pain.  All other systems reviewed and are negative.   Physical Exam Updated Vital Signs BP 132/86 (BP Location: Left Arm)   Pulse 82   Temp 98.7 F (37.1 C) (Oral)   Resp 19   SpO2 100%   Physical Exam Vitals and nursing note reviewed.  Constitutional:      Appearance: She is well-developed. She is obese.  HENT:     Head: Normocephalic and atraumatic.  Eyes:     Extraocular Movements: Extraocular movements intact.     Pupils: Pupils are equal, round, and reactive to light.  Cardiovascular:     Rate and Rhythm: Normal rate and regular rhythm.     Heart sounds: Normal heart  sounds.  Pulmonary:     Effort: Pulmonary effort is normal.     Breath sounds: Normal breath sounds.  Abdominal:     General: Bowel sounds are normal.     Palpations: Abdomen is soft.     Tenderness: There is abdominal tenderness in the right upper quadrant and epigastric area.  Musculoskeletal:        General: Normal range of motion.     Cervical back: Normal range of motion and neck supple.  Skin:    General: Skin is warm.     Capillary Refill: Capillary refill takes less than 2 seconds.  Neurological:     General: No focal deficit present.     Mental Status: She is alert and oriented to person, place, and time.     ED Results / Procedures / Treatments   Labs (all labs ordered are listed, but only abnormal results are displayed) Labs Reviewed  COMPREHENSIVE METABOLIC PANEL - Abnormal; Notable for the following components:      Result Value   Total Protein 8.3 (*)    Alkaline Phosphatase 34 (*)    All other components within normal limits  URINALYSIS, ROUTINE W REFLEX MICROSCOPIC - Abnormal; Notable for the following components:   Hgb urine dipstick TRACE (*)    Leukocytes,Ua SMALL (*)    All other components within normal limits  URINALYSIS, MICROSCOPIC (REFLEX) - Abnormal; Notable for the following components:   Bacteria, UA MANY (*)    All other components within normal limits  CBC WITH DIFFERENTIAL/PLATELET  LIPASE, BLOOD  PREGNANCY, URINE    EKG EKG Interpretation  Date/Time:  Saturday June 18 2020 10:09:15 EDT Ventricular Rate:  79 PR Interval:    QRS Duration: 81 QT Interval:  367 QTC Calculation: 421 R Axis:   54 Text Interpretation: Sinus rhythm Since last tracing rate slower Confirmed by Jacalyn Lefevre [phone]) on 06/18/2020 10:29:54 AM   Radiology CT Renal Stone Study  Result Date: 06/18/2020 CLINICAL DATA:  Right-sided flank pain. EXAM: CT ABDOMEN AND PELVIS WITHOUT CONTRAST TECHNIQUE: Multidetector CT imaging of the abdomen and pelvis was  performed following the standard protocol without IV contrast. COMPARISON:  None. FINDINGS: Lower chest: No acute abnormality. Hepatobiliary: No focal liver abnormality is seen. No gallstones, gallbladder wall thickening, or biliary dilatation. Pancreas: Unremarkable. No pancreatic ductal dilatation or surrounding inflammatory changes. Spleen: Normal in size without focal abnormality. Adrenals/Urinary Tract: Adrenal glands are unremarkable. Kidneys are normal, without renal calculi, focal  lesion, or hydronephrosis. Bladder is unremarkable. Stomach/Bowel: Stomach is within normal limits. Appendix appears normal. No evidence of bowel wall thickening, distention, or inflammatory changes. Submucosal fat within distal ileum, which may be the sequela of prior infection/inflammation. Vascular/Lymphatic: No significant vascular findings are present. No enlarged abdominal or pelvic lymph nodes. Reproductive: Uterus and bilateral adnexa are unremarkable for patient age. Musculoskeletal: No acute or significant osseous findings. IMPRESSION: No acute abnormality to explain the patient's pain. No radiodense calculi. No hydronephrosis. Normal appendix. Electronically Signed   By: Feliberto Harts MD   On: 06/18/2020 12:18   US Abdomen Limited RUQ  Result Date: 06/18/2020 CLINICAL DATA:  Right flank pain for 6 months EXAM: ULTRASOUND ABDOMEN LIMITED RIGHT UPPER QUADRANT COMPARISON:  Abdominal CT 02/19/2017 FINDINGS: Gallbladder: No gallstones or wall thickening visualized. No sonographic Murphy sign noted by sonographer. Common bile duct: Diameter: 2 mm Liver: No focal lesion identified. Within normal limits in parenchymal echogenicity. Portal vein is patent on color Doppler imaging with normal direction of blood flow towards the liver. IMPRESSION: Normal right upper quadrant ultrasound. Electronically Signed   By: Marnee Spring M.D.   On: 06/18/2020 11:21    Procedures Procedures (including critical care  time)  Medications Ordered in ED Medications  sodium chloride 0.9 % bolus 1,000 mL (0 mLs Intravenous Stopped 06/18/20 1209)    ED Course  I have reviewed the triage vital signs and the nursing notes.  Pertinent labs & imaging results that were available during my care of the patient were reviewed by me and considered in my medical decision making (see chart for details).    MDM Rules/Calculators/A&P                          Pt's bp has been normal here.  GB US and CT nl.  UA shows bacteria, but it is contaminated.  Pt will be started on pepcid and instructed to f/u with GI and with pcp.  Return if worse. Final Clinical Impression(s) / ED Diagnoses Final diagnoses:  Pain  Pain of upper abdomen  Atypical chest pain    Rx / DC Orders ED Discharge Orders         Ordered    famotidine (PEPCID) 20 MG tablet  Daily        06/18/20 1227           Jacalyn Lefevre, MD 06/18/20 1228

## 2020-06-18 NOTE — ED Notes (Signed)
Patient transported to us 

## 2020-06-18 NOTE — ED Notes (Signed)
Patient transported to CT 

## 2020-06-18 NOTE — ED Triage Notes (Signed)
Pt here with elevated BP, chest pain that started this morning and abdominal pain x 1 week.

## 2021-02-11 ENCOUNTER — Emergency Department (HOSPITAL_BASED_OUTPATIENT_CLINIC_OR_DEPARTMENT_OTHER): Payer: Medicaid Other

## 2021-02-11 ENCOUNTER — Other Ambulatory Visit: Payer: Self-pay

## 2021-02-11 ENCOUNTER — Encounter (HOSPITAL_BASED_OUTPATIENT_CLINIC_OR_DEPARTMENT_OTHER): Payer: Self-pay | Admitting: Emergency Medicine

## 2021-02-11 ENCOUNTER — Emergency Department (HOSPITAL_BASED_OUTPATIENT_CLINIC_OR_DEPARTMENT_OTHER)
Admission: EM | Admit: 2021-02-11 | Discharge: 2021-02-11 | Disposition: A | Discharge status: Home / Self Care | Payer: Medicaid Other | Attending: Emergency Medicine | Admitting: Emergency Medicine

## 2021-02-11 DIAGNOSIS — R0789 Other chest pain: Secondary | ICD-10-CM | POA: Insufficient documentation

## 2021-02-11 DIAGNOSIS — I10 Essential (primary) hypertension: Secondary | ICD-10-CM | POA: Diagnosis not present

## 2021-02-11 DIAGNOSIS — R079 Chest pain, unspecified: Secondary | ICD-10-CM

## 2021-02-11 DIAGNOSIS — Z79899 Other long term (current) drug therapy: Secondary | ICD-10-CM | POA: Insufficient documentation

## 2021-02-11 DIAGNOSIS — Z87891 Personal history of nicotine dependence: Secondary | ICD-10-CM | POA: Insufficient documentation

## 2021-02-11 LAB — CBC WITH DIFFERENTIAL/PLATELET
Abs Immature Granulocytes: 0 10*3/uL (ref 0.00–0.07)
Basophils Absolute: 0.1 10*3/uL (ref 0.0–0.1)
Basophils Relative: 2 %
Eosinophils Absolute: 0.1 10*3/uL (ref 0.0–0.5)
Eosinophils Relative: 2 %
HCT: 39.3 % (ref 36.0–46.0)
Hemoglobin: 13.6 g/dL (ref 12.0–15.0)
Immature Granulocytes: 0 %
Lymphocytes Relative: 40 %
Lymphs Abs: 1.6 10*3/uL (ref 0.7–4.0)
MCH: 31.7 pg (ref 26.0–34.0)
MCHC: 34.6 g/dL (ref 30.0–36.0)
MCV: 91.6 fL (ref 80.0–100.0)
Monocytes Absolute: 0.6 10*3/uL (ref 0.1–1.0)
Monocytes Relative: 13 %
Neutro Abs: 1.8 10*3/uL (ref 1.7–7.7)
Neutrophils Relative %: 43 %
Platelets: 253 10*3/uL (ref 150–400)
RBC: 4.29 MIL/uL (ref 3.87–5.11)
RDW: 12.3 % (ref 11.5–15.5)
WBC: 4.1 10*3/uL (ref 4.0–10.5)
nRBC: 0 % (ref 0.0–0.2)

## 2021-02-11 LAB — COMPREHENSIVE METABOLIC PANEL
ALT: 18 U/L (ref 0–44)
AST: 18 U/L (ref 15–41)
Albumin: 4 g/dL (ref 3.5–5.0)
Alkaline Phosphatase: 35 U/L — ABNORMAL LOW (ref 38–126)
Anion gap: 7 (ref 5–15)
BUN: 14 mg/dL (ref 6–20)
CO2: 23 mmol/L (ref 22–32)
Calcium: 8.8 mg/dL — ABNORMAL LOW (ref 8.9–10.3)
Chloride: 105 mmol/L (ref 98–111)
Creatinine, Ser: 0.82 mg/dL (ref 0.44–1.00)
GFR, Estimated: >60 mL/min (ref >60)
Glucose, Bld: 105 mg/dL — ABNORMAL HIGH (ref 70–99)
Potassium: 3.7 mmol/L (ref 3.5–5.1)
Sodium: 135 mmol/L (ref 135–145)
Total Bilirubin: 0.5 mg/dL (ref 0.3–1.2)
Total Protein: 7.6 g/dL (ref 6.5–8.1)

## 2021-02-11 LAB — TROPONIN I (HIGH SENSITIVITY)
Troponin I (High Sensitivity): <2 ng/L (ref <18)
Troponin I (High Sensitivity): <2 ng/L (ref <18)

## 2021-02-11 NOTE — ED Triage Notes (Signed)
Pt arrives pov, ambulatory to triage, c/o L side CP with shoulder pain that started 2 hrs pta.Pt deneis pain at this time in triage

## 2021-02-11 NOTE — ED Provider Notes (Signed)
Noted to [facility] [place] [facility]GENCY [facility] Provider Note   CSN: [id number] Arrival date & time: 02/11/21  1050     History Chief Complaint  Patient presents with  . Chest Pain    Virginia Wagner is a 35 y.o. female.  He has a history of high blood pressure.  She said she was at work when she started experiencing some left chest discomfort.  Radiated into left shoulder.  No shortness of breath diaphoresis nausea vomiting.  It came and went for about 2 hours and is now completely resolved.  She told the nurse and they checked her blood pressure and found it to be elevated.  She had not taken her blood pressure medication yet so she took it on the way here.  The history is provided by the patient.  Chest Pain Pain location:  L chest Pain quality: aching   Pain radiates to:  L shoulder Pain severity:  Moderate Onset quality:  Gradual Duration:  2 hours Timing:  Intermittent Progression:  Resolved Chronicity:  New Relieved by:  None tried Worsened by:  Nothing Ineffective treatments:  None tried Associated symptoms: no abdominal pain, no back pain, no cough, no diaphoresis, no fever, no headache, no heartburn, no lower extremity edema, no nausea, no shortness of breath and no vomiting   Risk factors: hypertension and smoking        Past Medical History:  Diagnosis Date  . Hypertension     There are no problems to display for this patient.   Past Surgical History:  Procedure Laterality Date  . HERNIA REPAIR    . TONSILLECTOMY       OB History   No obstetric history on file.     History reviewed. No pertinent family history.  Social History   Tobacco Use  . Smoking status: Former Smoker    Packs/day: 0.50    Types: Cigarettes    Quit date: 2017    Years since quitting: 5.3  . Smokeless tobacco: Never Used  Vaping Use  . Vaping Use: Never used  Substance Use Topics  . Alcohol use: Yes    Comment: rare  . Drug use: No    Home  Medications Prior to Admission medications   Medication Sig Start Date End Date Taking? Authorizing Provider  albuterol (PROVENTIL HFA;VENTOLIN HFA) 108 (90 Base) MCG/ACT inhaler Inhale 1-2 puffs into the lungs every 6 (six) hours as needed for wheezing or shortness of breath. 10/04/16   Rise Mu, PA-C  amLODipine (NORVASC) 2.5 MG tablet Take 2.5 mg by mouth daily.    [provider]  benzonatate (TESSALON) 100 MG capsule Take 1 capsule (100 mg total) by mouth every 8 (eight) hours. 10/04/16   Rise Mu, PA-C  cyclobenzaprine (FLEXERIL) 10 MG tablet Take 1 tablet (10 mg total) by mouth 2 (two) times daily as needed for muscle spasms. 02/28/20   Robinson, [place] N, PA-C  diphenhydrAMINE (BENADRYL) 25 MG tablet Take 2 tablets (50 mg total) by mouth every 6 (six) hours for 3 days. 02/08/19 02/11/19  Azalia Bilis, MD  famotidine (PEPCID) 20 MG tablet Take 1 tablet (20 mg total) by mouth daily. 06/18/20   Jacalyn Lefevre, MD  HYDROcodone-acetaminophen (NORCO/VICODIN) 5-325 MG tablet Take 1 tablet by mouth every 4 (four) hours as needed for severe pain. 06/09/16   Sam, Ace Gins, PA-C  ibuprofen (ADVIL,MOTRIN) 600 MG tablet Take 1 tablet (600 mg total) by mouth every 6 (six) hours as needed. 01/24/17  Jacalyn Lefevre, MD  loratadine (CLARITIN) 10 MG tablet Take 1 tablet (10 mg total) by mouth daily. 01/24/17   Jacalyn Lefevre, MD  methocarbamol (ROBAXIN) 500 MG tablet Take 1 tablet (500 mg total) by mouth 2 (two) times daily. 06/09/16   Sam, Ace Gins, PA-C  naproxen (NAPROSYN) 500 MG tablet Take 1 tablet (500 mg total) by mouth 2 (two) times daily. 06/09/16   Sam, Ace Gins, PA-C  predniSONE (STERAPRED UNI-PAK 21 TAB) 10 MG (21) TBPK tablet Take by mouth daily. Take 6 tabs by mouth daily  for 2 days, then 5 tabs for 2 days, then 4 tabs for 2 days, then 3 tabs for 2 days, 2 tabs for 2 days, then 1 tab by mouth daily for 2 days 06/09/19   Couture, Cortni S, PA-C    Allergies    Patient has  no known allergies.  Review of Systems   Review of Systems  Constitutional: Negative for diaphoresis and fever.  HENT: Negative for sore throat.   Eyes: Negative for visual disturbance.  Respiratory: Negative for cough and shortness of breath.   Cardiovascular: Positive for chest pain.  Gastrointestinal: Negative for abdominal pain, heartburn, nausea and vomiting.  Genitourinary: Negative for dysuria.  Musculoskeletal: Negative for back pain.  Skin: Negative for rash.  Neurological: Negative for headaches.    Physical Exam Updated Vital Signs Pulse (!) 103   Temp 98.5 F (36.9 C) (Oral)   Resp 17   Ht 5\' 4"  (1.626 m)   Wt 105.2 kg   SpO2 100%   BMI 39.82 kg/m   Physical Exam Vitals and nursing note reviewed.  Constitutional:      General: She is not in acute distress.    Appearance: She is well-developed.  HENT:     Head: Normocephalic and atraumatic.  Eyes:     Conjunctiva/sclera: Conjunctivae normal.  Cardiovascular:     Rate and Rhythm: Normal rate and regular rhythm.     Heart sounds: No murmur heard.   Pulmonary:     Effort: Pulmonary effort is normal. No respiratory distress.     Breath sounds: Normal breath sounds.  Abdominal:     Palpations: Abdomen is soft.     Tenderness: There is no abdominal tenderness.  Musculoskeletal:        General: Normal range of motion.     Cervical back: Neck supple.     Right lower leg: No tenderness.     Left lower leg: No tenderness.  Skin:    General: Skin is warm and dry.     Capillary Refill: Capillary refill takes less than 2 seconds.  Neurological:     General: No focal deficit present.     Mental Status: She is alert.     ED Results / Procedures / Treatments   Labs (all labs ordered are listed, but only abnormal results are displayed) Labs Reviewed  COMPREHENSIVE METABOLIC PANEL - Abnormal; Notable for the following components:      Result Value   Glucose, Bld 105 (*)    Calcium 8.8 (*)    Alkaline  Phosphatase 35 (*)    All other components within normal limits  CBC WITH DIFFERENTIAL/PLATELET  TROPONIN I (HIGH SENSITIVITY)  TROPONIN I (HIGH SENSITIVITY)    EKG EKG Interpretation  Date/Time:  Saturday Feb 11 2021 11:03:02 EDT Ventricular Rate:  100 PR Interval:  163 QRS Duration: 81 QT Interval:  335 QTC Calculation: 432 R Axis:   47 Text Interpretation: Sinus tachycardia  No significant change since prior 9/21 Confirmed by Meridee Score [phone]) on 02/11/2021 11:10:49 AM   Radiology DG Chest Port 1 View  Result Date: 02/11/2021 CLINICAL DATA:  35 year old female with chest pain. EXAM: PORTABLE CHEST 1 VIEW COMPARISON:  06/22/2020 FINDINGS: The cardiomediastinal silhouette is unremarkable. There is no evidence of focal airspace disease, pulmonary edema, suspicious pulmonary nodule/mass, pleural effusion, or pneumothorax. No acute bony abnormalities are identified. IMPRESSION: No active disease. Electronically Signed   By: Harmon Pier M.D.   On: 02/11/2021 11:45    Procedures Procedures   Medications Ordered in ED Medications - No data to display  ED Course  I have reviewed the triage vital signs and the nursing notes.  Pertinent labs & imaging results that were available during my care of the patient were reviewed by me and considered in my medical decision making (see chart for details).  Clinical Course as of 02/11/21 1813  Sat Feb 11, 2021  1129 Chest x-ray interpreted by me as no acute infiltrates.  Awaiting radiology reading. [MB]    Clinical Course User Index [MB] Terrilee Files, MD   MDM Rules/Calculators/A&P                         This patient complains of left-sided chest pain, she thinks it might be muscular but she wanted to make sure; this involves an extensive number of treatment Options and is a complaint that carries with it a high risk of complications and Morbidity. The differential includes ACS, pneumonia, pneumothorax, vascular, PE,  musculoskeletal, reflux  I ordered, reviewed and interpreted labs, which included CBC with normal white count normal hemoglobin, chemistries normal other than mildly elevated glucose, LFTs unremarkable, troponins flat I ordered imaging studies which included chest x-ray and I independently    visualized and interpreted imaging which showed no acute disease Previous records obtained and reviewed in epic, no similar presentations   After the interventions stated above, I reevaluated the patient and found patient to have remained pain-free and hemodynamically stable.  She is comfortable plan for outpatient follow-up with her PCP.  Return instructions discussed   Final Clinical Impression(s) / ED Diagnoses Final diagnoses:  Nonspecific chest pain    Rx / DC Orders ED Discharge Orders    None       Terrilee Files, MD 02/11/21 1815

## 2021-02-11 NOTE — ED Notes (Signed)
ED Provider at bedside. 

## 2021-02-11 NOTE — Discharge Instructions (Signed)
You are seen in the emergency department for evaluation of left-sided chest pain.  You had blood work EKG and a chest x-ray that did not show an obvious explanation for your pain.  Please contact your primary care doctor for close follow-up.  Return to the emergency department with any worsening or concerning symptoms

## 2021-10-26 ENCOUNTER — Encounter (HOSPITAL_BASED_OUTPATIENT_CLINIC_OR_DEPARTMENT_OTHER): Payer: Self-pay | Admitting: Emergency Medicine

## 2021-10-26 ENCOUNTER — Other Ambulatory Visit: Payer: Self-pay

## 2021-10-26 ENCOUNTER — Emergency Department (HOSPITAL_BASED_OUTPATIENT_CLINIC_OR_DEPARTMENT_OTHER)
Admission: EM | Admit: 2021-10-26 | Discharge: 2021-10-26 | Disposition: A | Discharge status: Home / Self Care | Payer: Medicaid Other | Attending: Emergency Medicine | Admitting: Emergency Medicine

## 2021-10-26 ENCOUNTER — Emergency Department (HOSPITAL_BASED_OUTPATIENT_CLINIC_OR_DEPARTMENT_OTHER): Payer: Medicaid Other

## 2021-10-26 DIAGNOSIS — R1013 Epigastric pain: Secondary | ICD-10-CM | POA: Insufficient documentation

## 2021-10-26 LAB — URINALYSIS, ROUTINE W REFLEX MICROSCOPIC
Bilirubin Urine: NEGATIVE
Glucose, UA: NEGATIVE mg/dL
Hgb urine dipstick: NEGATIVE
Ketones, ur: NEGATIVE mg/dL
Nitrite: NEGATIVE
Protein, ur: NEGATIVE mg/dL
Specific Gravity, Urine: 1.025 (ref 1.005–1.030)
pH: 7.5 (ref 5.0–8.0)

## 2021-10-26 LAB — COMPREHENSIVE METABOLIC PANEL
ALT: 20 U/L (ref 0–44)
AST: 20 U/L (ref 15–41)
Albumin: 4.3 g/dL (ref 3.5–5.0)
Alkaline Phosphatase: 44 U/L (ref 38–126)
Anion gap: 8 (ref 5–15)
BUN: 13 mg/dL (ref 6–20)
CO2: 25 mmol/L (ref 22–32)
Calcium: 9.3 mg/dL (ref 8.9–10.3)
Chloride: 104 mmol/L (ref 98–111)
Creatinine, Ser: 0.81 mg/dL (ref 0.44–1.00)
GFR, Estimated: >60 mL/min (ref >60)
Glucose, Bld: 102 mg/dL — ABNORMAL HIGH (ref 70–99)
Potassium: 4.2 mmol/L (ref 3.5–5.1)
Sodium: 137 mmol/L (ref 135–145)
Total Bilirubin: 0.7 mg/dL (ref 0.3–1.2)
Total Protein: 8.2 g/dL — ABNORMAL HIGH (ref 6.5–8.1)

## 2021-10-26 LAB — CBC
HCT: 41 % (ref 36.0–46.0)
Hemoglobin: 14.5 g/dL (ref 12.0–15.0)
MCH: 31.7 pg (ref 26.0–34.0)
MCHC: 35.4 g/dL (ref 30.0–36.0)
MCV: 89.7 fL (ref 80.0–100.0)
Platelets: 269 10*3/uL (ref 150–400)
RBC: 4.57 MIL/uL (ref 3.87–5.11)
RDW: 12.1 % (ref 11.5–15.5)
WBC: 4 10*3/uL (ref 4.0–10.5)
nRBC: 0 % (ref 0.0–0.2)

## 2021-10-26 LAB — URINALYSIS, MICROSCOPIC (REFLEX)

## 2021-10-26 LAB — LIPASE, BLOOD: Lipase: 30 U/L (ref 11–51)

## 2021-10-26 LAB — PREGNANCY, URINE: Preg Test, Ur: NEGATIVE

## 2021-10-26 NOTE — ED Triage Notes (Signed)
Pt c/o upper abd pain with constipation. Pt saw GI doctor 1 week and received linzes which made her have BM but hasn't had one since.

## 2021-10-26 NOTE — ED Provider Notes (Signed)
[facility] EMERGENCY DEPARTMENT  Provider Note  CSN: [id number] Arrival date & time: 10/26/21 0865  History Chief Complaint  Patient presents with   Abdominal Pain    Virginia Wagner is a 36 y.o. female reports stomach issues ongoing for several weeks/months associated with constipation. She has seen GI and has had upper endoscopy and [place] which were unremarkable. She was given samples of Linzess a few weeks ago but she has only been taking it PRN. She reports she hasn't been able to have a bowel movement without taking a laxative/stool softener or prune juice but she has been having loose stools when she does. During the night, she began having sharp pain in her epigastric area. Not associated with vomiting or fever. She is concerned that she has to take a medication to have a BM and wants to make sure she doesn't have a blockage.    Home Medications Prior to Admission medications   Medication Sig Start Date End Date Taking? Authorizing Provider  albuterol (PROVENTIL HFA;VENTOLIN HFA) 108 (90 Base) MCG/ACT inhaler Inhale 1-2 puffs into the lungs every 6 (six) hours as needed for wheezing or shortness of breath. 10/04/16   Rise Mu, PA-C  amLODipine (NORVASC) 2.5 MG tablet Take 2.5 mg by mouth daily.    [provider]  famotidine (PEPCID) 20 MG tablet Take 1 tablet (20 mg total) by mouth daily. 06/18/20   Jacalyn Lefevre, MD     Allergies    Patient has no known allergies.   Review of Systems   Review of Systems Please see HPI for pertinent positives and negatives  Physical Exam BP 122/79    Pulse 98    Temp 98.1 F (36.7 C) (Oral)    Resp 16    Ht 5\' 4"  (1.626 m)    Wt 108.9 kg    SpO2 99%    BMI 41.20 kg/m   Physical Exam Vitals and nursing note reviewed.  Constitutional:      Appearance: Normal appearance.  HENT:     Head: Normocephalic and atraumatic.     Nose: Nose normal.     Mouth/Throat:     Mouth: Mucous membranes are moist.  Eyes:      Extraocular Movements: Extraocular movements intact.     Conjunctiva/sclera: Conjunctivae normal.  Cardiovascular:     Rate and Rhythm: Normal rate.  Pulmonary:     Effort: Pulmonary effort is normal.     Breath sounds: Normal breath sounds.  Abdominal:     General: Abdomen is flat.     Palpations: Abdomen is soft.     Tenderness: There is abdominal tenderness in the epigastric area. There is no guarding. Negative signs include Murphy's sign and McBurney's sign.  Musculoskeletal:        General: No swelling. Normal range of motion.     Cervical back: Neck supple.  Skin:    General: Skin is warm and dry.  Neurological:     General: No focal deficit present.     Mental Status: She is alert.  Psychiatric:        Mood and Affect: Mood normal.    ED Results / Procedures / Treatments   EKG None  Procedures Procedures  Medications Ordered in the ED Medications - No data to display  Initial Impression and Plan  Patient with epigastric pain and constipation is here mostly to make sure she doesn't have a blockage. She does not have rectal pain to suggest a fecal  impaction. Will check labs to ensure no signs of hepatobiliary process. Check XR to address her concerns of a stool blockage. She was advised that Linzess is meant to be an every-day medication and not PRN.   ED Course   Clinical Course as of 10/26/21 0818  Thu Oct 26, 2021  0737 CBC is normal. HCG is neg.  [CS]  0740 CMP and lipase are normal.  [CS]  0813 AAS is neg for signs of obstruction or free air. UA is negative for infection. Again encouraged to take her Linzess daily to see if that helps her symptoms. Continued management at GI.  [CS]    Clinical Course User Index [CS] Pollyann Savoy, MD     MDM Rules/Calculators/A&P Medical Decision Making Problems Addressed: Epigastric pain: acute illness or injury that poses a threat to life or bodily functions  Amount and/or Complexity of Data Reviewed Labs:  ordered. Decision-making details documented in ED Course. Radiology: ordered and independent interpretation performed. Decision-making details documented in ED Course.  Risk OTC drugs. Prescription drug management. Decision regarding hospitalization.    Final Clinical Impression(s) / ED Diagnoses Final diagnoses:  Epigastric pain    Rx / DC Orders ED Discharge Orders     None        Pollyann Savoy, MD 10/26/21 [phone]

## 2022-01-19 ENCOUNTER — Other Ambulatory Visit: Payer: Self-pay

## 2022-01-19 ENCOUNTER — Encounter (HOSPITAL_BASED_OUTPATIENT_CLINIC_OR_DEPARTMENT_OTHER): Payer: Self-pay

## 2022-01-19 ENCOUNTER — Emergency Department (HOSPITAL_BASED_OUTPATIENT_CLINIC_OR_DEPARTMENT_OTHER)
Admission: EM | Admit: 2022-01-19 | Discharge: 2022-01-19 | Disposition: A | Discharge status: Home / Self Care | Payer: Medicaid Other | Attending: Emergency Medicine | Admitting: Emergency Medicine

## 2022-01-19 ENCOUNTER — Emergency Department (HOSPITAL_BASED_OUTPATIENT_CLINIC_OR_DEPARTMENT_OTHER): Payer: Medicaid Other

## 2022-01-19 DIAGNOSIS — R079 Chest pain, unspecified: Secondary | ICD-10-CM | POA: Insufficient documentation

## 2022-01-19 DIAGNOSIS — I10 Essential (primary) hypertension: Secondary | ICD-10-CM | POA: Insufficient documentation

## 2022-01-19 DIAGNOSIS — Z79899 Other long term (current) drug therapy: Secondary | ICD-10-CM | POA: Insufficient documentation

## 2022-01-19 DIAGNOSIS — R110 Nausea: Secondary | ICD-10-CM | POA: Diagnosis not present

## 2022-01-19 DIAGNOSIS — E876 Hypokalemia: Secondary | ICD-10-CM | POA: Diagnosis not present

## 2022-01-19 LAB — CBC
HCT: 39.8 % (ref 36.0–46.0)
Hemoglobin: 14 g/dL (ref 12.0–15.0)
MCH: 32 pg (ref 26.0–34.0)
MCHC: 35.2 g/dL (ref 30.0–36.0)
MCV: 90.9 fL (ref 80.0–100.0)
Platelets: 264 10*3/uL (ref 150–400)
RBC: 4.38 MIL/uL (ref 3.87–5.11)
RDW: 12.7 % (ref 11.5–15.5)
WBC: 4.5 10*3/uL (ref 4.0–10.5)
nRBC: 0 % (ref 0.0–0.2)

## 2022-01-19 LAB — BASIC METABOLIC PANEL
Anion gap: 7 (ref 5–15)
BUN: 14 mg/dL (ref 6–20)
CO2: 25 mmol/L (ref 22–32)
Calcium: 8.9 mg/dL (ref 8.9–10.3)
Chloride: 104 mmol/L (ref 98–111)
Creatinine, Ser: 0.7 mg/dL (ref 0.44–1.00)
GFR, Estimated: >60 mL/min (ref >60)
Glucose, Bld: 117 mg/dL — ABNORMAL HIGH (ref 70–99)
Potassium: 3.3 mmol/L — ABNORMAL LOW (ref 3.5–5.1)
Sodium: 136 mmol/L (ref 135–145)

## 2022-01-19 LAB — TROPONIN I (HIGH SENSITIVITY): Troponin I (High Sensitivity): <2 ng/L (ref <18)

## 2022-01-19 MED ORDER — IBUPROFEN 800 MG PO TABS
800.0000 mg | ORAL_TABLET | Freq: Once | ORAL | Status: AC
  Administered 2022-01-19: 800 mg via ORAL
  Filled 2022-01-19: qty 1

## 2022-01-19 NOTE — ED Triage Notes (Signed)
Patient complains of intermittent sharp chest pain x yesterday.  ?

## 2022-01-19 NOTE — Discharge Instructions (Addendum)
You were seen in the emergency department today for chest pain. ? ?As we discussed your lab work and imaging looked reassuring today.  Your pain could be related to your muscles or possibly reflux.  We have given you some ibuprofen.  If you feel you need any more medication when get home, you can take some Tylenol. ? ?I recommend for the omeprazole that you have been prescribed to take this 30 minutes before your largest meal of the day, as this is when its most effective. ? ?Continue to monitor how you're doing and return to the ER for new or worsening symptoms.  ?

## 2022-01-19 NOTE — ED Provider Notes (Signed)
?[facility] EMERGENCY DEPARTMENT ?Provider Note ? ? ?CSN: [id number] ?Arrival date & time: 01/19/22  1407 ? ?  ? ?History ? ?Chief Complaint  ?Patient presents with  ? Chest Pain  ? ? ?Virginia Wagner is a 36 y.o. female with history of hypertension presents the emergency department complaining of chest pain.  Patient states that starting yesterday she has had several episodes of sharp chest pain in the middle of her chest, radiating to the left side.  States she has never had symptoms like this before.  No aggravating or alleviating factors.  No associated shortness of breath, diaphoresis, syncope.  She did feel slightly nauseous earlier today with a episode of chest pain, but no vomiting.  No infectious symptoms. She has not tried anything for her symptoms. No leg pain or swelling. Does not take OCPs. No recent surgery or extended travel.  ? ? ?Chest Pain ?Associated symptoms: nausea   ?Associated symptoms: no abdominal pain, no back pain, no cough, no fever, no palpitations, no shortness of breath and no vomiting   ? ?  ? ?Home Medications ?Prior to Admission medications   ?Medication Sig Start Date End Date Taking? Authorizing Provider  ?albuterol (PROVENTIL HFA;VENTOLIN HFA) 108 (90 Base) MCG/ACT inhaler Inhale 1-2 puffs into the lungs every 6 (six) hours as needed for wheezing or shortness of breath. 10/04/16   Demetrios Loll T, PA-C  ?amLODipine (NORVASC) 2.5 MG tablet Take 2.5 mg by mouth daily.    [provider]  ?famotidine (PEPCID) 20 MG tablet Take 1 tablet (20 mg total) by mouth daily. 06/18/20   Jacalyn Lefevre, MD  ?   ? ?Allergies    ?Patient has no known allergies.   ? ?Review of Systems   ?Review of Systems  ?Constitutional:  Negative for fever.  ?Respiratory:  Negative for cough and shortness of breath.   ?Cardiovascular:  Positive for chest pain. Negative for palpitations and leg swelling.  ?Gastrointestinal:  Positive for nausea. Negative for abdominal pain, constipation,  diarrhea and vomiting.  ?Musculoskeletal:  Negative for back pain.  ?All other systems reviewed and are negative. ? ?Physical Exam ?Updated Vital Signs ?BP 120/70   Pulse 85   Temp 98.6 ?F (37 ?C) (Oral)   Resp 14   Ht 5\' 5"  (1.651 m)   Wt 109.8 kg   LMP  (LMP Unknown)   SpO2 100%   BMI 40.27 kg/m?  ?Physical Exam ?Vitals and nursing note reviewed.  ?Constitutional:   ?   Appearance: Normal appearance.  ?HENT:  ?   Head: Normocephalic and atraumatic.  ?Eyes:  ?   Conjunctiva/sclera: Conjunctivae normal.  ?Cardiovascular:  ?   Rate and Rhythm: Normal rate and regular rhythm.  ?   Pulses:     ?     Posterior tibial pulses are 2+ on the right side and 2+ on the left side.  ?Pulmonary:  ?   Effort: Pulmonary effort is normal. No respiratory distress.  ?   Breath sounds: Normal breath sounds.  ?Abdominal:  ?   General: There is no distension.  ?   Palpations: Abdomen is soft.  ?   Tenderness: There is no abdominal tenderness.  ?Musculoskeletal:  ?   Right lower leg: No edema.  ?   Left lower leg: No edema.  ?Skin: ?   General: Skin is warm and dry.  ?Neurological:  ?   General: No focal deficit present.  ?   Mental Status: She is alert.  ? ? ?  ED Results / Procedures / Treatments   ?Labs ?(all labs ordered are listed, but only abnormal results are displayed) ?Labs Reviewed  ?BASIC METABOLIC PANEL - Abnormal; Notable for the following components:  ?    Result Value  ? Potassium 3.3 (*)   ? Glucose, Bld 117 (*)   ? All other components within normal limits  ?CBC  ?PREGNANCY, URINE  ?TROPONIN I (HIGH SENSITIVITY)  ? ? ?EKG ?None ? ?Radiology ?DG Chest 2 View ? ?Result Date: 01/19/2022 ?CLINICAL DATA:  Intermittent sharp chest pain since yesterday. EXAM: CHEST - 2 VIEW COMPARISON:  Radiographs 10/26/2021 and 02/11/2021. FINDINGS: The heart size and mediastinal contours are normal. The lungs are clear. There is no pleural effusion or pneumothorax. No acute osseous findings are identified. IMPRESSION: No active  cardiopulmonary process. Electronically Signed   By: Carey Bullocks M.D.   On: 01/19/2022 14:43   ? ?Procedures ?Procedures  ? ? ?Medications Ordered in ED ?Medications  ?ibuprofen (ADVIL) tablet 800 mg (800 mg Oral Given 01/19/22 1550)  ? ? ?ED Course/ Medical Decision Making/ A&P ?  ?                        ?Medical Decision Making ?Amount and/or Complexity of Data Reviewed ?Labs: ordered. ?Radiology: ordered. ? ?Risk ?Prescription drug management. ? ? ?This patient presents to the ED for concern of chest pain, this involves an extensive number of treatment options, and is a complaint that carries with it a high risk of complications and morbidity. The emergent differential diagnosis prior to evaluation includes, but is not limited to,  ACS, pericarditis, aortic dissection, PE, pneumothorax, esophageal spasm or rupture, chronic angina, valvular disease, cardiomyopathy, myocarditis, pulmonary HTN, pneumonia, bronchitis, GERD, reflux/PUD, biliary disease, pancreatitis, disk disease or arthritis, costochondritis, anxiety or panic attack, herpes zoster, breast disorders, chest wall tumors. This is not an exhaustive differential.  ? ?Past Medical History / Co-morbidities / Social History: ?Hypertension ? ?Additional history: ?Chart reviewed. Pertinent results include: Patient was seen at the beginning of February for epigastric pain, in which she was encouraged to follow-up with her GI doctor.  She states that she has previously been prescribed omeprazole, but has not taken this yet. ? ?Physical Exam: ?Physical exam performed. The pertinent findings include: Patient is afebrile, not tachycardic, not hypoxic, no acute distress.  Abdomen is soft, nontender.  No leg swelling noted. ? ?Lab Tests: ?I ordered, and personally interpreted labs.  The pertinent results include: No leukocytosis, normal hemoglobin.  Mild hypokalemia 3.3, otherwise electrolytes grossly within normal limits.  Troponin of <2.  ?  ?Imaging Studies: ?I  ordered imaging studies including chest x-ray. I independently visualized and interpreted imaging which showed no acute cardiopulmonary abnormalities. I agree with the radiologist interpretation. ?  ?Cardiac Monitoring:  ?The patient was maintained on a cardiac monitor.  My attending physician viewed and interpreted the cardiac monitored which showed an underlying rhythm of: normal sinus rhythm. I agree with this interpretation.  ?  ?Medications: ?I ordered medication including ibuprofen for likely chest wall pain. I have reviewed the patients home medicines and have made adjustments as needed. ? ?Disposition: ?After consideration of the diagnostic results and the patients response to treatment, I feel that Patient is to be discharged with recommendation to follow up with PCP in regards to today's hospital visit. Chest pain is not likely of cardiac or pulmonary etiology d/t presentation, PERC for PE negative, VSS, no tracheal deviation, no JVD or new  murmur, RRR, breath sounds equal bilaterally, EKG without acute abnormalities, negative troponin, and negative CXR. Heart score of 1. Pt has been advised to return to the ED if CP becomes exertional, associated with diaphoresis or nausea, radiates to left jaw/arm, worsens or becomes concerning in any way. Pt appears reliable for follow up and is agreeable to discharge.  ? ?Final Clinical Impression(s) / ED Diagnoses ?Final diagnoses:  ?Nonspecific chest pain  ? ? ?Rx / DC Orders ?ED Discharge Orders   ? ? None  ? ?  ? ?Portions of this report may have been transcribed using voice recognition software. Every effort was made to ensure accuracy; however, inadvertent computerized transcription errors may be present. ? ?  ?Su MonksRoemhildt, Kylynn Street T, PA-C ?01/19/22 1557 ? ?  ?Franne FortsGray, Alicia P, DO ?01/21/22 2110 ? ?

## 2022-04-26 ENCOUNTER — Other Ambulatory Visit: Payer: Self-pay

## 2022-04-26 ENCOUNTER — Emergency Department (HOSPITAL_BASED_OUTPATIENT_CLINIC_OR_DEPARTMENT_OTHER): Payer: Medicaid Other

## 2022-04-26 ENCOUNTER — Emergency Department (HOSPITAL_BASED_OUTPATIENT_CLINIC_OR_DEPARTMENT_OTHER)
Admission: EM | Admit: 2022-04-26 | Discharge: 2022-04-26 | Disposition: A | Discharge status: Home / Self Care | Payer: Medicaid Other | Attending: Emergency Medicine | Admitting: Emergency Medicine

## 2022-04-26 ENCOUNTER — Encounter (HOSPITAL_BASED_OUTPATIENT_CLINIC_OR_DEPARTMENT_OTHER): Payer: Self-pay | Admitting: Emergency Medicine

## 2022-04-26 DIAGNOSIS — I10 Essential (primary) hypertension: Secondary | ICD-10-CM | POA: Insufficient documentation

## 2022-04-26 DIAGNOSIS — R002 Palpitations: Secondary | ICD-10-CM | POA: Diagnosis present

## 2022-04-26 DIAGNOSIS — M79605 Pain in left leg: Secondary | ICD-10-CM | POA: Diagnosis not present

## 2022-04-26 DIAGNOSIS — M79604 Pain in right leg: Secondary | ICD-10-CM | POA: Diagnosis not present

## 2022-04-26 LAB — CBC WITH DIFFERENTIAL/PLATELET
Abs Immature Granulocytes: 0.01 10*3/uL (ref 0.00–0.07)
Basophils Absolute: 0.1 10*3/uL (ref 0.0–0.1)
Basophils Relative: 2 %
Eosinophils Absolute: 0.2 10*3/uL (ref 0.0–0.5)
Eosinophils Relative: 4 %
HCT: 43.5 % (ref 36.0–46.0)
Hemoglobin: 15.1 g/dL — ABNORMAL HIGH (ref 12.0–15.0)
Immature Granulocytes: 0 %
Lymphocytes Relative: 34 %
Lymphs Abs: 1.4 10*3/uL (ref 0.7–4.0)
MCH: 32 pg (ref 26.0–34.0)
MCHC: 34.7 g/dL (ref 30.0–36.0)
MCV: 92.2 fL (ref 80.0–100.0)
Monocytes Absolute: 0.5 10*3/uL (ref 0.1–1.0)
Monocytes Relative: 12 %
Neutro Abs: 1.9 10*3/uL (ref 1.7–7.7)
Neutrophils Relative %: 48 %
Platelets: 257 10*3/uL (ref 150–400)
RBC: 4.72 MIL/uL (ref 3.87–5.11)
RDW: 12.1 % (ref 11.5–15.5)
WBC: 4 10*3/uL (ref 4.0–10.5)
nRBC: 0 % (ref 0.0–0.2)

## 2022-04-26 LAB — URINALYSIS, MICROSCOPIC (REFLEX)

## 2022-04-26 LAB — URINALYSIS, ROUTINE W REFLEX MICROSCOPIC
Bilirubin Urine: NEGATIVE
Glucose, UA: NEGATIVE mg/dL
Ketones, ur: NEGATIVE mg/dL
Nitrite: NEGATIVE
Protein, ur: NEGATIVE mg/dL
Specific Gravity, Urine: 1.025 (ref 1.005–1.030)
pH: 7 (ref 5.0–8.0)

## 2022-04-26 LAB — COMPREHENSIVE METABOLIC PANEL
ALT: 16 U/L (ref 0–44)
AST: 21 U/L (ref 15–41)
Albumin: 4.3 g/dL (ref 3.5–5.0)
Alkaline Phosphatase: 42 U/L (ref 38–126)
Anion gap: 7 (ref 5–15)
BUN: 13 mg/dL (ref 6–20)
CO2: 25 mmol/L (ref 22–32)
Calcium: 9.3 mg/dL (ref 8.9–10.3)
Chloride: 105 mmol/L (ref 98–111)
Creatinine, Ser: 0.8 mg/dL (ref 0.44–1.00)
GFR, Estimated: >60 mL/min (ref >60)
Glucose, Bld: 102 mg/dL — ABNORMAL HIGH (ref 70–99)
Potassium: 4.4 mmol/L (ref 3.5–5.1)
Sodium: 137 mmol/L (ref 135–145)
Total Bilirubin: 0.8 mg/dL (ref 0.3–1.2)
Total Protein: 8.4 g/dL — ABNORMAL HIGH (ref 6.5–8.1)

## 2022-04-26 LAB — PREGNANCY, URINE: Preg Test, Ur: NEGATIVE

## 2022-04-26 LAB — LIPASE, BLOOD: Lipase: 27 U/L (ref 11–51)

## 2022-04-26 LAB — TROPONIN I (HIGH SENSITIVITY): Troponin I (High Sensitivity): <2 ng/L (ref <18)

## 2022-04-26 NOTE — Discharge Instructions (Signed)
Please keep your primary care physician appointment tomorrow and have them review your emergency department evaluation.  If you develop any new or suddenly worsening symptoms in the meantime please return to the emergency department for reevaluation.

## 2022-04-26 NOTE — ED Notes (Signed)
Patient transported to X-ray 

## 2022-04-26 NOTE — ED Provider Notes (Signed)
Emergency Department Provider Note   I have reviewed the triage vital signs and the nursing notes.   HISTORY  Chief Complaint Palpitations (Leg pain)   HPI Virginia Wagner is a 36 y.o. female with past history of hypertension presents to the emergency department for evaluation of throbbing/cramping pain in the bilateral lower extremities along with heart palpitations starting today.  The pain in the bilateral legs is equal on both sides and not clearly provoked by movement or other changes.  No numbness or weakness.  No swelling.  Patient is compliant with her home blood pressure medications including amlodipine and does not take a statin.  She developed heart palpitations this morning with some mild lightheadedness.  She did not experience discomfort in her chest or shortness of breath.  She did not pass out.  She has been on Depo in the past but has been off this medication for at least 1 year, likely longer.  She has no recent travel or surgery.  No cancer history.   Past Medical History:  Diagnosis Date   Hypertension     Review of Systems  Constitutional: No fever/chills Eyes: No visual changes. ENT: No sore throat. Cardiovascular: Denies chest pain. Positive palpitations.  Respiratory: Denies shortness of breath. Gastrointestinal: No abdominal pain.  No nausea, no vomiting.  No diarrhea.  No constipation. Genitourinary: Negative for dysuria. Musculoskeletal: Negative for back pain. Positive bilateral leg pain.  Skin: Negative for rash. Neurological: Negative for headaches, focal weakness or numbness.  ____________________________________________   PHYSICAL EXAM:  VITAL SIGNS: ED Triage Vitals  Enc Vitals Group     BP 04/26/22 1103 126/81     Pulse Rate 04/26/22 1103 91     Resp 04/26/22 1103 18     Temp 04/26/22 1103 98.6 F (37 C)     Temp Source 04/26/22 1103 Oral     SpO2 04/26/22 1103 100 %     Weight 04/26/22 1059 240 lb (108.9 kg)     Height 04/26/22  1059 5\' 6"  (1.676 m)   Constitutional: Alert and oriented. Well appearing and in no acute distress. Eyes: Conjunctivae are normal.  Head: Atraumatic. Nose: No congestion/rhinnorhea. Mouth/Throat: Mucous membranes are moist.   Neck: No stridor.   Cardiovascular: Normal rate, regular rhythm. Good peripheral circulation with 2+ DP pulses bilaterally. Grossly normal heart sounds.   Respiratory: Normal respiratory effort.  No retractions. Lungs CTAB. Gastrointestinal: Soft and nontender. No distention.  Musculoskeletal: No lower extremity tenderness nor edema. No gross deformities of extremities. Neurologic:  Normal speech and language. No gross focal neurologic deficits are appreciated.  Skin:  Skin is warm, dry and intact. No rash noted.  ____________________________________________   LABS (all labs ordered are listed, but only abnormal results are displayed)  Labs Reviewed  URINALYSIS, ROUTINE W REFLEX MICROSCOPIC - Abnormal; Notable for the following components:      Result Value   Hgb urine dipstick MODERATE (*)    Leukocytes,Ua TRACE (*)    All other components within normal limits  URINALYSIS, MICROSCOPIC (REFLEX) - Abnormal; Notable for the following components:   Bacteria, UA MANY (*)    All other components within normal limits  COMPREHENSIVE METABOLIC PANEL - Abnormal; Notable for the following components:   Glucose, Bld 102 (*)    Total Protein 8.4 (*)    All other components within normal limits  CBC WITH DIFFERENTIAL/PLATELET - Abnormal; Notable for the following components:   Hemoglobin 15.1 (*)    All other components within  normal limits  URINE CULTURE  PREGNANCY, URINE  LIPASE, BLOOD  TROPONIN I (HIGH SENSITIVITY)   ____________________________________________  EKG   EKG Interpretation  Date/Time:  Thursday April 26 2022 11:02:20 EDT Ventricular Rate:  83 PR Interval:  143 QRS Duration: 84 QT Interval:  358 QTC Calculation: 421 R Axis:   68 Text  Interpretation: Sinus rhythm Confirmed by Alona Bene [phone]) on 04/26/2022 11:19:04 AM        ____________________________________________  RADIOLOGY  US Venous Img Lower Bilateral  Result Date: 04/26/2022 CLINICAL DATA:  Bilateral leg pain. EXAM: BILATERAL LOWER EXTREMITY VENOUS DOPPLER ULTRASOUND TECHNIQUE: Gray-scale sonography with compression, as well as color and duplex ultrasound, were performed to evaluate the deep venous system(s) from the level of the common femoral vein through the popliteal and proximal calf veins. COMPARISON:  None Available. FINDINGS: VENOUS Normal compressibility of the common femoral, superficial femoral, and popliteal veins, as well as the visualized calf veins. Visualized portions of profunda femoral vein and great saphenous vein unremarkable. No filling defects to suggest DVT on grayscale or color Doppler imaging. Doppler waveforms show normal direction of venous flow, normal respiratory plasticity and response to augmentation. Limited views of the contralateral common femoral vein are unremarkable. OTHER None. Limitations: none IMPRESSION: Negative. Electronically Signed   By: Larose Hires D.O.   On: 04/26/2022 12:34   DG Chest 2 View  Result Date: 04/26/2022 CLINICAL DATA:  Palpitations EXAM: CHEST - 2 VIEW COMPARISON:  01/11/2022 FINDINGS: Cardiac and mediastinal contours are within normal limits. No focal pulmonary opacity. No pleural effusion or pneumothorax. No acute osseous abnormality. IMPRESSION: No acute cardiopulmonary process. Electronically Signed   By: Wiliam Ke M.D.   On: 04/26/2022 12:14    ____________________________________________   PROCEDURES  Procedure(s) performed:   Procedures  None  ____________________________________________   INITIAL IMPRESSION / ASSESSMENT AND PLAN / ED COURSE  Pertinent labs & imaging results that were available during my care of the patient were reviewed by me and considered in my medical decision  making (see chart for details).   This patient is Presenting for Evaluation of palpitations, which does require a range of treatment options, and is a complaint that involves a high risk of morbidity and mortality.  The Differential Diagnoses include MSK strain, fracture, compartment syndrome, cellulitis, PAD, arrhythmia, etc.   I decided to review pertinent External Data, and in summary no PCP records available in our system.   Clinical Laboratory Tests Ordered, included UA with many bacteria and some hemoglobin with trace leukocytes but no symptoms.  Plan to send the urine for culture.  No leukocytosis or anemia.  No acute kidney injury.  Troponin is normal.  Pregnancy test negative.  Radiologic Tests Ordered, included DVT US and CXR. I independently interpreted the images and agree with radiology interpretation.   Cardiac Monitor Tracing which shows NSR. No ectopy.    Social Determinants of Health Risk patient has a smoking history.   Medical Decision Making: Summary:  Patient presents to the emergency department heart palpitations and bilateral leg discomfort.  Evaluation here is negative for DVT.  No injury to suspect fracture or dislocation.  Compartments are soft and there is no evidence of cellulitis or peripheral edema causing symptoms.  Strong pulses in the feet with normal sensation.  Plan for close PCP follow-up regarding this issue.  In terms of palpitations the patient's EKG and work-up here is also reassuring including negative troponin and reassuring telemetry monitoring here along with EKG which is  unremarkable.  She sees her primary care physician tomorrow.  In review of her home medications I do not see an obvious offending agent here.  Plan for continued p.o. hydration at home and close PCP follow-up tomorrow as scheduled.  Considered admission but on reassessment the patient's work-up here is reassuring and she has close follow-up with her PCP tomorrow.  Defer admission for  now.  Disposition: discharge  ____________________________________________  FINAL CLINICAL IMPRESSION(S) / ED DIAGNOSES  Final diagnoses:  Heart palpitations  Bilateral leg pain   Note:  This document was prepared using Dragon voice recognition software and may include unintentional dictation errors.  Alona Bene, MD, [facility] Emergency Medicine    Cole Klugh, Arlyss Repress, MD 04/26/22 [phone]

## 2022-04-26 NOTE — ED Notes (Signed)
ED Provider at bedside. 

## 2022-04-26 NOTE — ED Notes (Signed)
Pts IV was started at 1233 in the [facility]. Pts IV was removed at 1341.

## 2022-04-26 NOTE — ED Triage Notes (Addendum)
--  Last 2 days she has been having lower leg and chest palpations. Lower back pain yesterday, none today. Scheduled to see PCP tomorrow

## 2022-04-27 LAB — URINE CULTURE: Culture: <10000 — AB

## 2022-05-17 IMAGING — CR DG CHEST 2V
2 series · 2 of 2 positions shown · non-contrast
Comparison: 12/05/2018

CLINICAL DATA: Body aches a began 2 days ago, thoracolumbar
cramping

EXAM:
CHEST - 2 VIEW

[w chest pa]
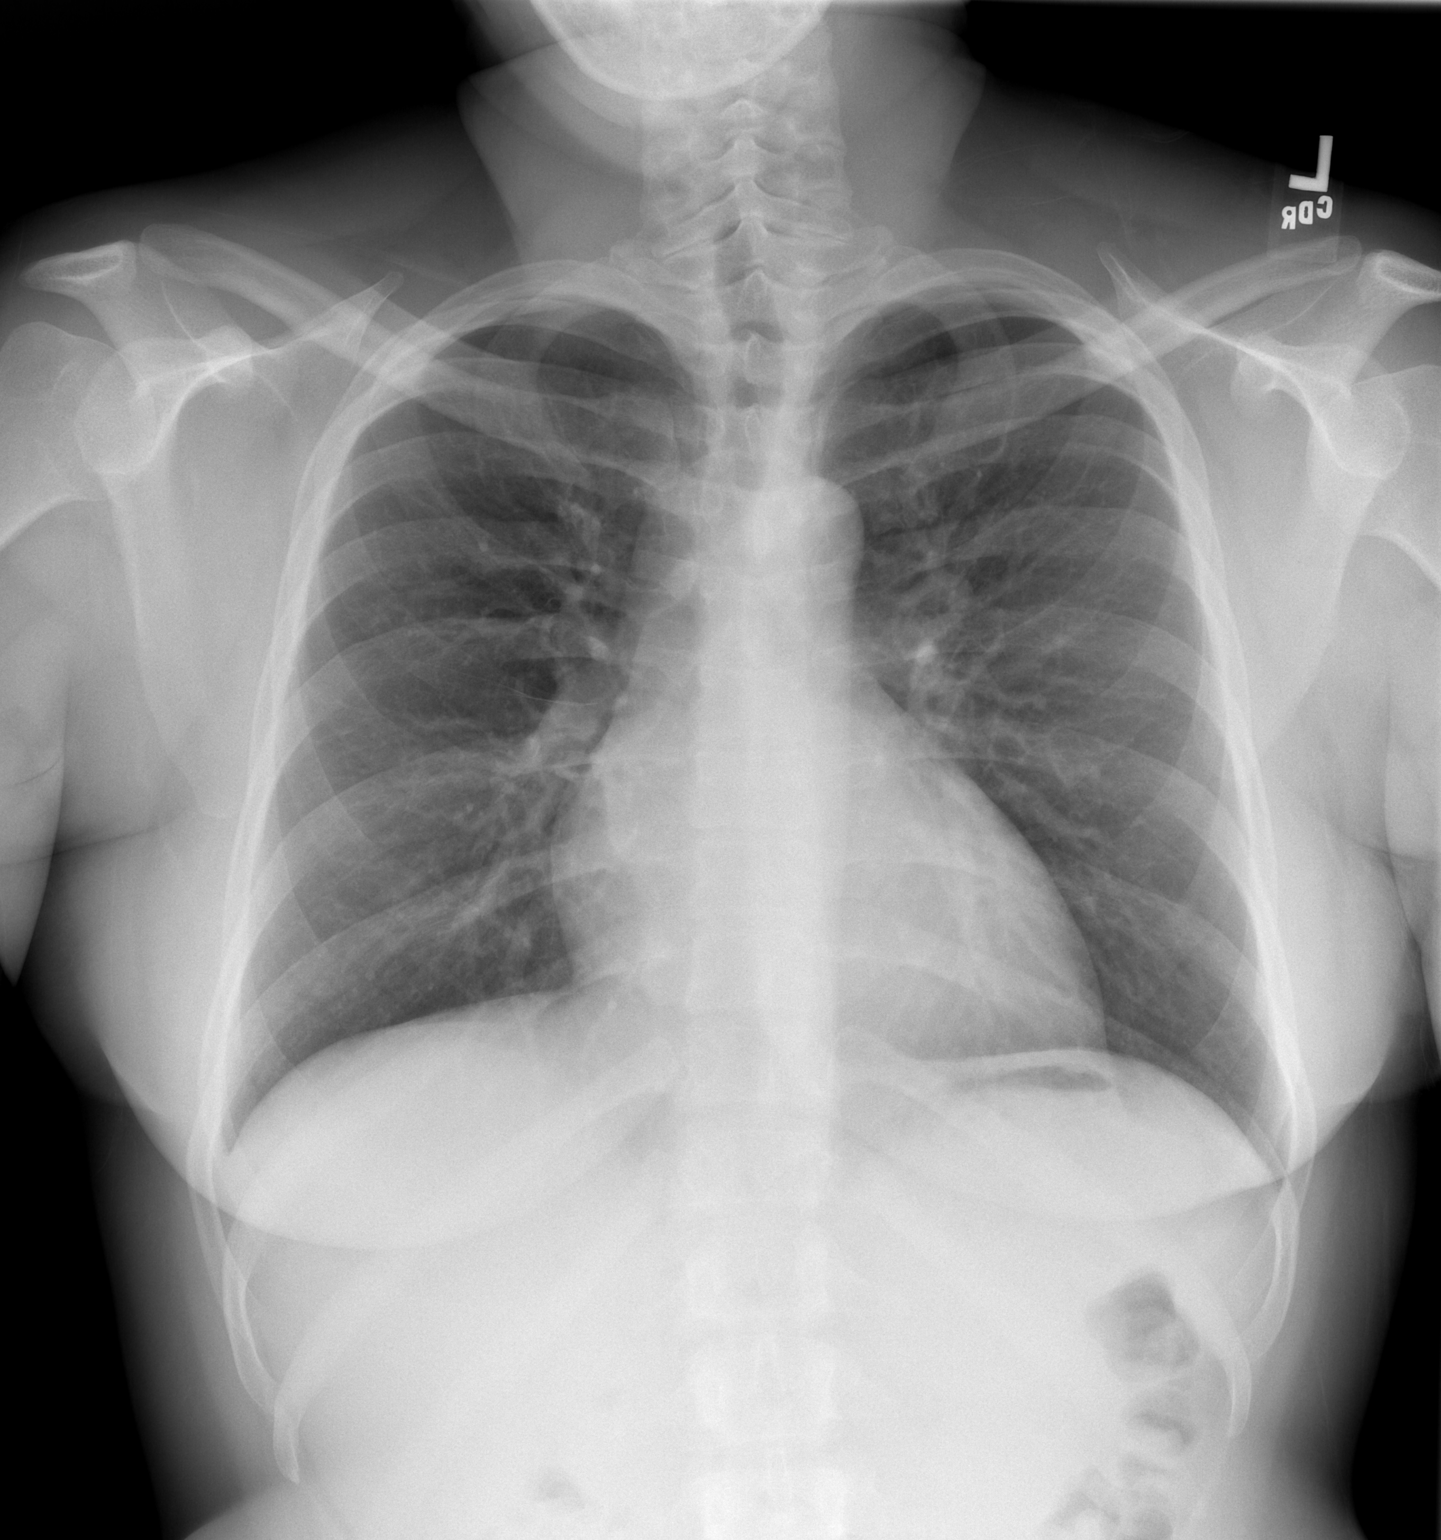

[w chest lat]
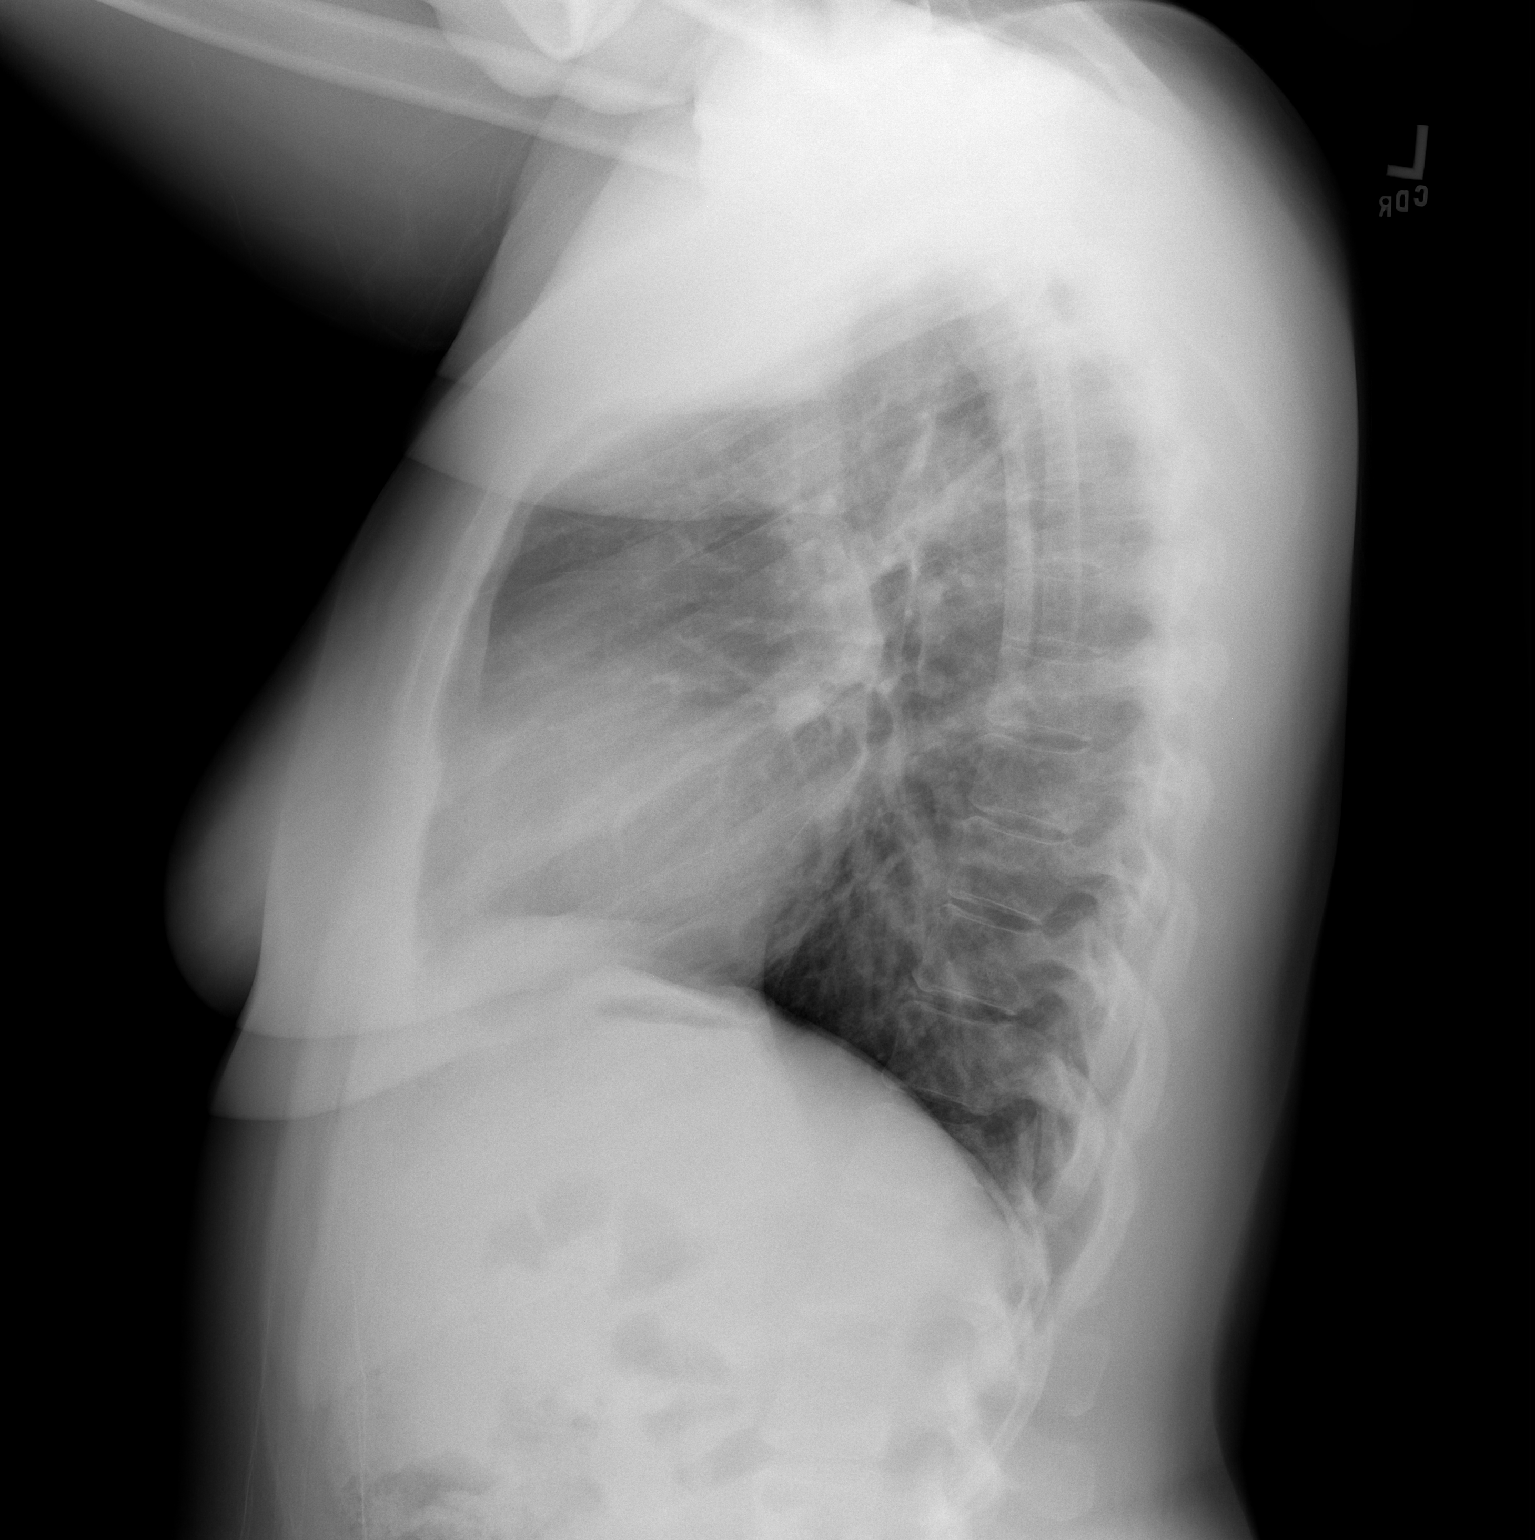

[2 of 2 positions shown; findings below may reference images not displayed]

FINDINGS: The heart size and mediastinal contours are within normal limits.
Both lungs are clear. The visualized skeletal structures are
unremarkable.
IMPRESSION: No active cardiopulmonary disease.

## 2023-05-01 IMAGING — DX DG CHEST 1V PORT
1 series · 1 of 1 positions shown · non-contrast
Comparison: 06/22/2020

CLINICAL DATA: 35-year-old female with chest pain.

EXAM:
PORTABLE CHEST 1 VIEW

[chest ap]
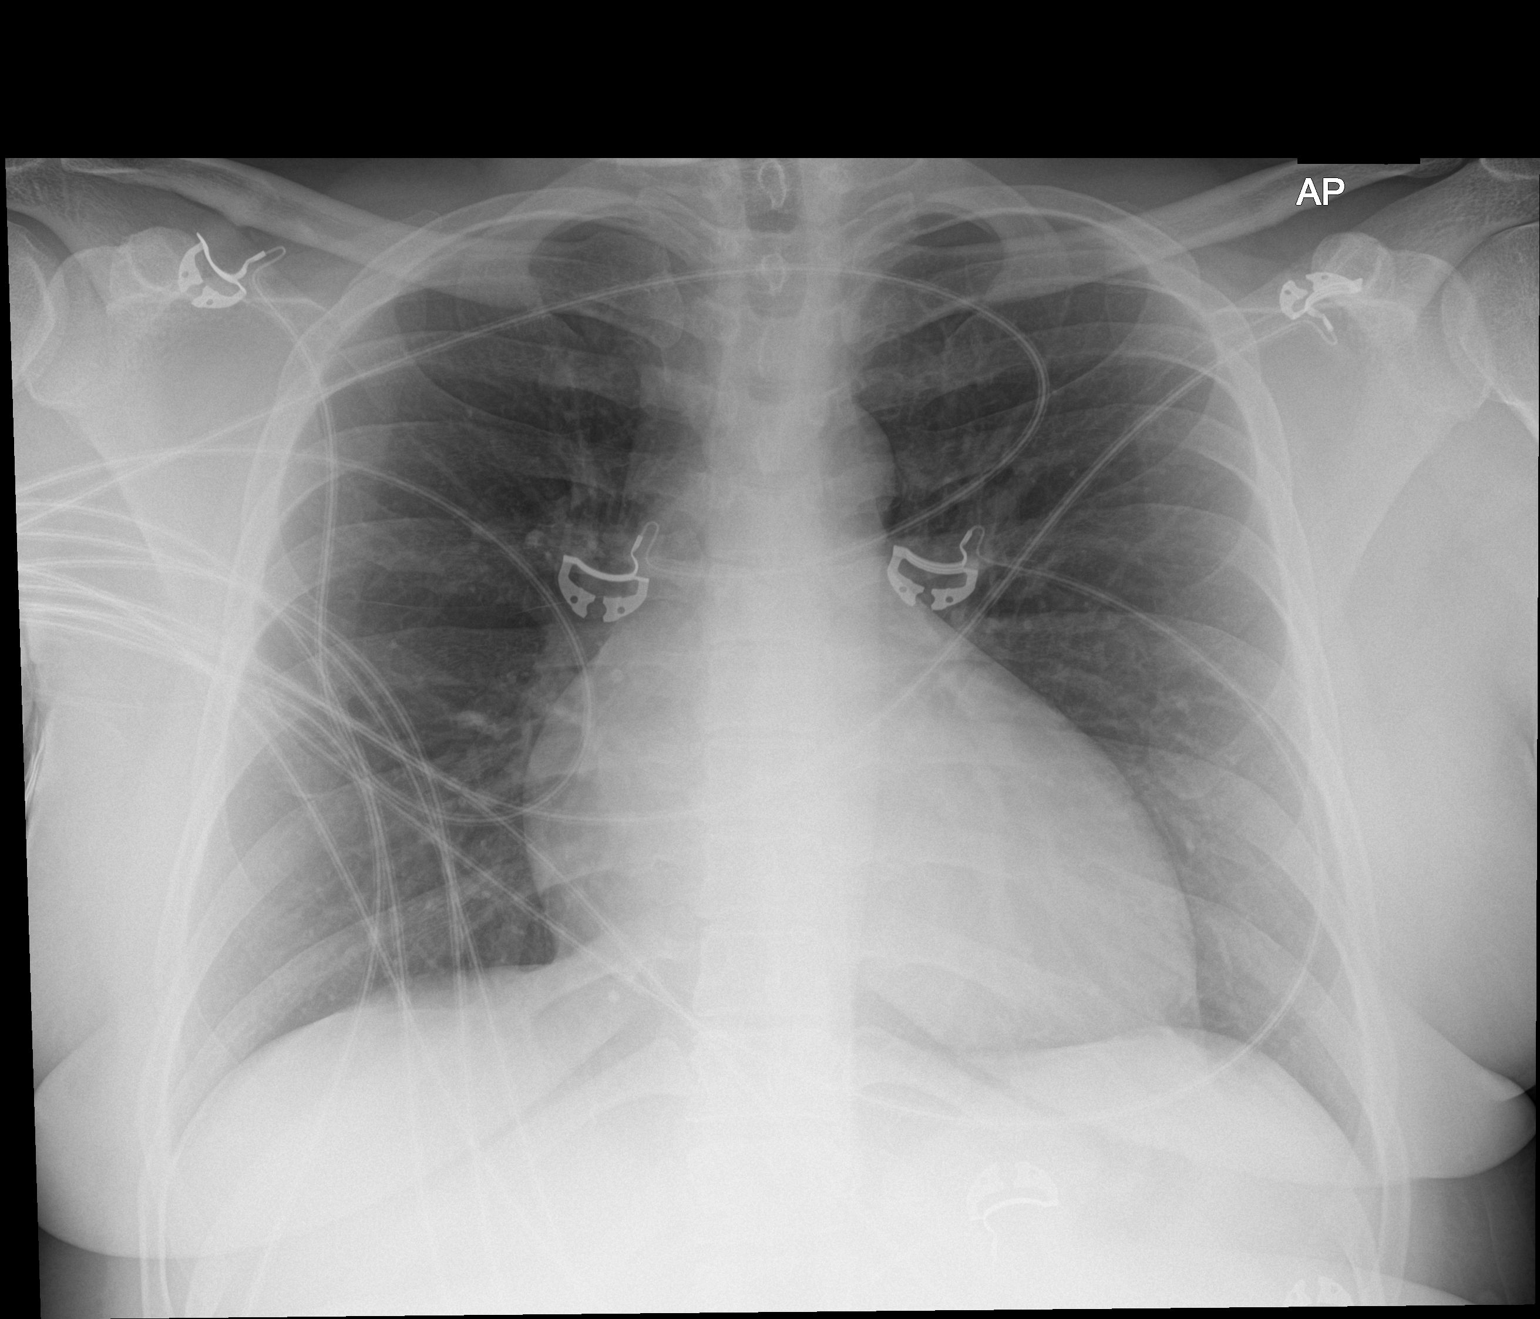

[1 of 1 positions shown; findings below may reference images not displayed]

FINDINGS: The cardiomediastinal silhouette is unremarkable.

There is no evidence of focal airspace disease, pulmonary edema,
suspicious pulmonary nodule/mass, pleural effusion, or pneumothorax.

No acute bony abnormalities are identified.
IMPRESSION: No active disease.

## 2023-10-16 ENCOUNTER — Encounter (HOSPITAL_BASED_OUTPATIENT_CLINIC_OR_DEPARTMENT_OTHER): Payer: Self-pay

## 2023-10-16 ENCOUNTER — Emergency Department (HOSPITAL_BASED_OUTPATIENT_CLINIC_OR_DEPARTMENT_OTHER): Payer: BLUE CROSS/BLUE SHIELD

## 2023-10-16 ENCOUNTER — Other Ambulatory Visit: Payer: Self-pay

## 2023-10-16 ENCOUNTER — Emergency Department (HOSPITAL_BASED_OUTPATIENT_CLINIC_OR_DEPARTMENT_OTHER)
Admission: EM | Admit: 2023-10-16 | Discharge: 2023-10-16 | Disposition: A | Discharge status: Home / Self Care | Payer: BLUE CROSS/BLUE SHIELD | Attending: Emergency Medicine | Admitting: Emergency Medicine

## 2023-10-16 DIAGNOSIS — R109 Unspecified abdominal pain: Secondary | ICD-10-CM | POA: Diagnosis present

## 2023-10-16 DIAGNOSIS — N12 Tubulo-interstitial nephritis, not specified as acute or chronic: Secondary | ICD-10-CM | POA: Insufficient documentation

## 2023-10-16 DIAGNOSIS — F17210 Nicotine dependence, cigarettes, uncomplicated: Secondary | ICD-10-CM | POA: Diagnosis not present

## 2023-10-16 DIAGNOSIS — Z20822 Contact with and (suspected) exposure to covid-19: Secondary | ICD-10-CM | POA: Insufficient documentation

## 2023-10-16 LAB — COMPREHENSIVE METABOLIC PANEL
ALT: 19 U/L (ref 0–44)
AST: 16 U/L (ref 15–41)
Albumin: 3.8 g/dL (ref 3.5–5.0)
Alkaline Phosphatase: 40 U/L (ref 38–126)
Anion gap: 9 (ref 5–15)
BUN: 11 mg/dL (ref 6–20)
CO2: 24 mmol/L (ref 22–32)
Calcium: 8.9 mg/dL (ref 8.9–10.3)
Chloride: 103 mmol/L (ref 98–111)
Creatinine, Ser: 0.81 mg/dL (ref 0.44–1.00)
GFR, Estimated: >60 mL/min (ref >60)
Glucose, Bld: 107 mg/dL — ABNORMAL HIGH (ref 70–99)
Potassium: 3.4 mmol/L — ABNORMAL LOW (ref 3.5–5.1)
Sodium: 136 mmol/L (ref 135–145)
Total Bilirubin: 0.4 mg/dL (ref 0.0–1.2)
Total Protein: 7.6 g/dL (ref 6.5–8.1)

## 2023-10-16 LAB — CBC WITH DIFFERENTIAL/PLATELET
Abs Immature Granulocytes: 0.01 10*3/uL (ref 0.00–0.07)
Basophils Absolute: 0 10*3/uL (ref 0.0–0.1)
Basophils Relative: 1 %
Eosinophils Absolute: 0.1 10*3/uL (ref 0.0–0.5)
Eosinophils Relative: 2 %
HCT: 38.2 % (ref 36.0–46.0)
Hemoglobin: 13.4 g/dL (ref 12.0–15.0)
Immature Granulocytes: 0 %
Lymphocytes Relative: 23 %
Lymphs Abs: 1.3 10*3/uL (ref 0.7–4.0)
MCH: 30.9 pg (ref 26.0–34.0)
MCHC: 35.1 g/dL (ref 30.0–36.0)
MCV: 88 fL (ref 80.0–100.0)
Monocytes Absolute: 0.5 10*3/uL (ref 0.1–1.0)
Monocytes Relative: 10 %
Neutro Abs: 3.6 10*3/uL (ref 1.7–7.7)
Neutrophils Relative %: 64 %
Platelets: 249 10*3/uL (ref 150–400)
RBC: 4.34 MIL/uL (ref 3.87–5.11)
RDW: 11.9 % (ref 11.5–15.5)
WBC: 5.5 10*3/uL (ref 4.0–10.5)
nRBC: 0 % (ref 0.0–0.2)

## 2023-10-16 LAB — URINALYSIS, ROUTINE W REFLEX MICROSCOPIC
Bilirubin Urine: NEGATIVE
Glucose, UA: NEGATIVE mg/dL
Ketones, ur: NEGATIVE mg/dL
Nitrite: NEGATIVE
Protein, ur: NEGATIVE mg/dL
Specific Gravity, Urine: 1.01 (ref 1.005–1.030)
pH: 7 (ref 5.0–8.0)

## 2023-10-16 LAB — URINALYSIS, MICROSCOPIC (REFLEX)

## 2023-10-16 LAB — RESP PANEL BY RT-PCR (RSV, FLU A&B, COVID)  RVPGX2
Influenza A by PCR: NEGATIVE
Influenza B by PCR: NEGATIVE
Resp Syncytial Virus by PCR: NEGATIVE
SARS Coronavirus 2 by RT PCR: NEGATIVE

## 2023-10-16 LAB — PREGNANCY, URINE: Preg Test, Ur: NEGATIVE

## 2023-10-16 LAB — LIPASE, BLOOD: Lipase: 23 U/L (ref 11–51)

## 2023-10-16 MED ORDER — CEFPODOXIME PROXETIL 200 MG PO TABS: 200.0000 mg | ORAL_TABLET | Freq: Two times a day (BID) | ORAL | 0 refills | Status: AC

## 2023-10-16 MED ORDER — IOHEXOL 300 MG/ML  SOLN
100.0000 mL | Freq: Once | INTRAMUSCULAR | Status: AC | PRN
  Administered 2023-10-16: 100 mL via INTRAVENOUS

## 2023-10-16 MED ORDER — FLUCONAZOLE 150 MG PO TABS: 150.0000 mg | ORAL_TABLET | Freq: Every day | ORAL | 0 refills | Status: AC

## 2023-10-16 MED ORDER — POTASSIUM CHLORIDE CRYS ER 20 MEQ PO TBCR
20.0000 meq | EXTENDED_RELEASE_TABLET | Freq: Once | ORAL | Status: AC
  Administered 2023-10-16: 20 meq via ORAL
  Filled 2023-10-16: qty 1

## 2023-10-16 MED ORDER — FENTANYL CITRATE PF 50 MCG/ML IJ SOSY
50.0000 ug | PREFILLED_SYRINGE | Freq: Once | INTRAMUSCULAR | Status: DC
  Filled 2023-10-16: qty 1

## 2023-10-16 MED ORDER — KETOROLAC TROMETHAMINE 15 MG/ML IJ SOLN
15.0000 mg | Freq: Once | INTRAMUSCULAR | Status: AC
  Administered 2023-10-16: 15 mg via INTRAVENOUS
  Filled 2023-10-16: qty 1

## 2023-10-16 NOTE — ED Provider Notes (Signed)
[facility] Provider Note   CSN: [id number] Arrival date & time: 10/16/23  3086     History  Chief Complaint  Patient presents with   Flank Pain    Virginia Wagner is a 38 y.o. female.   Flank Pain Associated symptoms include abdominal pain.     38 year old female with medical history significant for cigarette smoking, hernia repair presenting to the emergency department with abdominal pain.  The patient states that she has had chronic pain in her abdomen intermittently over the past year.  She states that she is having bilateral lower abdominal pain, worse in the left lower quadrant.  Pain comes and goes in waves.  There is currently moderate in severity.  She denies any nausea or vomiting.  She denies any vaginal bleeding, dysuria or any urinary frequency.  She denies any flank pain.  She denies any leakage of fluid.  She states that her last menstrual period was over a year ago and she had been undergoing Depo-Provera shots.  She has not had a menstrual period since.  She does not think she is pregnant.  She has been sexually active.  She is not currently on any birth control.  Additionally, the patient states that she started smoking again over the weekend and has had a cough ever since that is mildly productive.  She thinks she may have bronchitis.  No known sick contacts, no fevers or chills.  Home Medications Prior to Admission medications   Medication Sig Start Date End Date Taking? Authorizing Provider  cefpodoxime (VANTIN) 200 MG tablet Take 1 tablet (200 mg total) by mouth 2 (two) times daily for 10 days. 10/16/23 10/26/23 Yes Ernie Avena, MD  fluconazole (DIFLUCAN) 150 MG tablet Take 1 tablet (150 mg total) by mouth daily for 1 dose. 10/16/23 10/17/23 Yes Ernie Avena, MD  albuterol (PROVENTIL HFA;VENTOLIN HFA) 108 (90 Base) MCG/ACT inhaler Inhale 1-2 puffs into the lungs every 6 (six) hours as needed for wheezing or shortness of  breath. 10/04/16   Rise Mu, PA-C  amLODipine (NORVASC) 2.5 MG tablet Take 2.5 mg by mouth daily.    [provider]  famotidine (PEPCID) 20 MG tablet Take 1 tablet (20 mg total) by mouth daily. 06/18/20   Jacalyn Lefevre, MD      Allergies    Patient has no allergy information on record.    Review of Systems   Review of Systems  Gastrointestinal:  Positive for abdominal pain.  All other systems reviewed and are negative.   Physical Exam Updated Vital Signs BP 125/83   Pulse 80   Temp 98.7 F (37.1 C) (Oral)   Resp 18   Ht 5\' 5"  (1.651 m)   Wt 99.8 kg   SpO2 100%   BMI 36.61 kg/m  Physical Exam Vitals and nursing note reviewed.  Constitutional:      General: She is not in acute distress.    Appearance: She is well-developed.  HENT:     Head: Normocephalic and atraumatic.  Eyes:     Conjunctiva/sclera: Conjunctivae normal.  Cardiovascular:     Rate and Rhythm: Normal rate and regular rhythm.     Heart sounds: No murmur heard. Pulmonary:     Effort: Pulmonary effort is normal. No respiratory distress.     Breath sounds: Normal breath sounds.  Abdominal:     Palpations: Abdomen is soft.     Tenderness: There is abdominal tenderness in the left lower quadrant.  There is left CVA tenderness. There is no guarding or rebound.  Musculoskeletal:        General: No swelling.     Cervical back: Neck supple.  Skin:    General: Skin is warm and dry.     Capillary Refill: Capillary refill takes less than 2 seconds.  Neurological:     Mental Status: She is alert.  Psychiatric:        Mood and Affect: Mood normal.     ED Results / Procedures / Treatments   Labs (all labs ordered are listed, but only abnormal results are displayed) Labs Reviewed  COMPREHENSIVE METABOLIC PANEL - Abnormal; Notable for the following components:      Result Value   Potassium 3.4 (*)    Glucose, Bld 107 (*)    All other components within normal limits  URINALYSIS, ROUTINE  W REFLEX MICROSCOPIC - Abnormal; Notable for the following components:   Hgb urine dipstick SMALL (*)    Leukocytes,Ua TRACE (*)    All other components within normal limits  URINALYSIS, MICROSCOPIC (REFLEX) - Abnormal; Notable for the following components:   Bacteria, UA RARE (*)    All other components within normal limits  RESP PANEL BY RT-PCR (RSV, FLU A&B, COVID)  [id number]  URINE CULTURE  CBC WITH DIFFERENTIAL/PLATELET  LIPASE, BLOOD  PREGNANCY, URINE    EKG None  Radiology CT ABDOMEN PELVIS W CONTRAST Result Date: 10/16/2023 CLINICAL DATA:  Chronic abdominal pain. EXAM: CT ABDOMEN AND PELVIS WITH CONTRAST TECHNIQUE: Multidetector CT imaging of the abdomen and pelvis was performed using the standard protocol following bolus administration of intravenous contrast. RADIATION DOSE REDUCTION: This exam was performed according to the departmental dose-optimization program which includes automated exposure control, adjustment of the mA and/or kV according to patient size and/or use of iterative reconstruction technique. CONTRAST:  OMNIPAQUE IOHEXOL 300 MG/ML  SOLN COMPARISON:  CT abdomen pelvis dated June 18, 2020. FINDINGS: Lower chest: No acute abnormality. Hepatobiliary: No focal liver abnormality is seen. No gallstones, gallbladder wall thickening, or biliary dilatation. Pancreas: Unremarkable. No pancreatic ductal dilatation or surrounding inflammatory changes. Spleen: Normal in size without focal abnormality. Adrenals/Urinary Tract: Adrenal glands are unremarkable. No renal mass, calculi, or hydronephrosis. Asymmetric urothelial thickening of the left renal pelvis and proximal ureter. The bladder is unremarkable. Stomach/Bowel: Small hiatal hernia. The stomach is otherwise within normal limits. No bowel wall thickening, distention, or surrounding inflammatory changes. Normal appendix. Vascular/Lymphatic: No significant vascular findings are present. No enlarged abdominal or pelvic  lymph nodes. Reproductive: Uterus and bilateral adnexa are unremarkable. 2.7 cm dominant follicle in the right ovary. No follow-up imaging is recommended. Other: No free fluid or pneumoperitoneum. Musculoskeletal: No acute or significant osseous findings. IMPRESSION: 1. Asymmetric urothelial thickening of the left renal pelvis and proximal ureter, which may reflect ascending urinary tract infection. Correlate with urinalysis. Electronically Signed   By: Obie Dredge M.D.   On: 10/16/2023 09:16    Procedures Procedures    Medications Ordered in ED Medications  ketorolac (TORADOL) 15 MG/ML injection 15 mg (15 mg Intravenous Given 10/16/23 0758)  iohexol (OMNIPAQUE) 300 MG/ML solution 100 mL (100 mLs Intravenous Contrast Given 10/16/23 0841)  potassium chloride SA (KLOR-CON M) CR tablet 20 mEq (20 mEq Oral Given 10/16/23 1324)    ED Course/ Medical Decision Making/ A&P  Medical Decision Making Amount and/or Complexity of Data Reviewed Labs: ordered. Radiology: ordered.  Risk Prescription drug management.    38 year old female with medical history significant for cigarette smoking, hernia repair presenting to the emergency department with abdominal pain.  The patient states that she has had chronic pain in her abdomen intermittently over the past year.  She states that she is having bilateral lower abdominal pain, worse in the left lower quadrant.  Pain comes and goes in waves.  There is currently moderate in severity.  She denies any nausea or vomiting.  She denies any vaginal bleeding, dysuria or any urinary frequency.  She denies any flank pain.  She denies any leakage of fluid.  She states that her last menstrual period was over a year ago and she had been undergoing Depo-Provera shots.  She has not had a menstrual period since.  She does not think she is pregnant.  She has been sexually active.  She is not currently on any birth control.  Additionally, the  patient states that she started smoking again over the weekend and has had a cough ever since that is mildly productive.  She thinks she may have bronchitis.  No known sick contacts, no fevers or chills.  Medical Decision Making:   Arethia Attebury is a 38 y.o. female who presented to the ED today with abdominal pain, detailed above.    Patient placed on continuous vitals and telemetry monitoring while in ED which was reviewed periodically.  Complete initial physical exam performed, notably the patient was tender to palpation in the LLQ.     Reviewed and confirmed nursing documentation for past medical history, family history, social history.    Initial Assessment:   With the patient's presentation of abdominal pain, most likely diagnosis is diverticulitis, constipation. Other diagnoses were considered including (but not limited to) gastroenteritis, colitis, small bowel obstruction, appendicitis, cholecystitis, pancreatitis, nephrolithiasis, UTI, pyelonephritis, ruptured ectopic pregnancy, PID, ovarian torsion. These are considered less likely due to history of present illness and physical exam findings.   This is most consistent with an acute complicated illness   Initial Plan:  CBC/CMP to evaluate for underlying infectious/metabolic etiology for patient's abdominal pain  Lipase to evaluate for pancreatitis  EKG to evaluate for cardiac source of pain  CTAB/Pelvis with contrast to evaluate for structural/surgical etiology of patients' severe abdominal pain.  Urinalysis and repeat physical assessment to evaluate for UTI/Pyelonpehritis  Empiric management of symptoms with escalating pain control and antiemetics as needed.  Initial urine pregnancy negative  Initial Study Results:   Laboratory  All laboratory results reviewed without evidence of clinically relevant pathology.   Exceptions include: UA with trace leukocytes, rare bacteria, small Hgb. K 3.4.     Radiology All images reviewed  independently. Agree with radiology report at this time.   CT ABDOMEN PELVIS W CONTRAST Result Date: 10/16/2023 CLINICAL DATA:  Chronic abdominal pain. EXAM: CT ABDOMEN AND PELVIS WITH CONTRAST TECHNIQUE: Multidetector CT imaging of the abdomen and pelvis was performed using the standard protocol following bolus administration of intravenous contrast. RADIATION DOSE REDUCTION: This exam was performed according to the departmental dose-optimization program which includes automated exposure control, adjustment of the mA and/or kV according to patient size and/or use of iterative reconstruction technique. CONTRAST:  OMNIPAQUE IOHEXOL 300 MG/ML  SOLN COMPARISON:  CT abdomen pelvis dated June 18, 2020. FINDINGS: Lower chest: No acute abnormality. Hepatobiliary: No focal liver abnormality is seen. No gallstones, gallbladder wall thickening, or biliary dilatation. Pancreas:  Unremarkable. No pancreatic ductal dilatation or surrounding inflammatory changes. Spleen: Normal in size without focal abnormality. Adrenals/Urinary Tract: Adrenal glands are unremarkable. No renal mass, calculi, or hydronephrosis. Asymmetric urothelial thickening of the left renal pelvis and proximal ureter. The bladder is unremarkable. Stomach/Bowel: Small hiatal hernia. The stomach is otherwise within normal limits. No bowel wall thickening, distention, or surrounding inflammatory changes. Normal appendix. Vascular/Lymphatic: No significant vascular findings are present. No enlarged abdominal or pelvic lymph nodes. Reproductive: Uterus and bilateral adnexa are unremarkable. 2.7 cm dominant follicle in the right ovary. No follow-up imaging is recommended. Other: No free fluid or pneumoperitoneum. Musculoskeletal: No acute or significant osseous findings. IMPRESSION: 1. Asymmetric urothelial thickening of the left renal pelvis and proximal ureter, which may reflect ascending urinary tract infection. Correlate with urinalysis.  Electronically Signed   By: Obie Dredge M.D.   On: 10/16/2023 09:16       Final Reassessment and Plan:   Pt CT with asymmetric urothelial thickening on the left, correlates with patient's LLQ and flank pain. Will treat with ABX for early pyelonephritis. Recommended close PCP follow-up for continued outpatient surveillance, supportive care with Tylenol/Motrin, and outpatient follow-up with Urology as the patient states that she has had multiple recurrent UTIs over the past year. Urine culture sent. Pt pain control, stable on reassessment for DC.  Final Clinical Impression(s) / ED Diagnoses Final diagnoses:  Pyelonephritis    Rx / DC Orders ED Discharge Orders          Ordered    cefpodoxime (VANTIN) 200 MG tablet  2 times daily        10/16/23 1003    fluconazole (DIFLUCAN) 150 MG tablet  Daily        10/16/23 1006              Ernie Avena, MD 10/16/23 1006

## 2023-10-16 NOTE — ED Notes (Signed)
Patient transported to CT 

## 2023-10-16 NOTE — Discharge Instructions (Addendum)
Please follow-up with Urology regarding your recurrent UTIs. Your CT scan revealed: IMPRESSION:  1. Asymmetric urothelial thickening of the left renal pelvis and  proximal ureter, which may reflect ascending urinary tract  infection. Correlate with urinalysis.   Take Tylenol and NSAIDs for pain control. Antibiotics have been prescribed. Diflucan as needed if you develop sx of a yeast infection on ABX

## 2023-10-16 NOTE — ED Triage Notes (Signed)
Pt states she has had right side pain x1 year. Since yesterday, it is different and has been radiating through back and abdomen. Pt also has bronchitis and would like something for that.   Robina Ade, RN

## 2023-10-18 LAB — URINE CULTURE: Culture: 80000 — AB

## 2023-10-19 ENCOUNTER — Telehealth (HOSPITAL_BASED_OUTPATIENT_CLINIC_OR_DEPARTMENT_OTHER): Payer: Self-pay | Admitting: *Deleted

## 2023-10-19 NOTE — Telephone Encounter (Signed)
Post ED Visit - Positive Culture Follow-up  Culture report reviewed by antimicrobial stewardship pharmacist: [facility] []  Enzo Bi, Pharm.D. []  Celedonio Miyamoto, Pharm.D., BCPS AQ-ID []  Garvin Fila, Pharm.D., BCPS []  Georgina Pillion, Pharm.D., BCPS []  [place], [address].D., BCPS, AAHIVP []  Estella Husk, Pharm.D., BCPS, AAHIVP []  Lysle Pearl, PharmD, BCPS []  Phillips Climes, PharmD, BCPS []  Agapito Games, PharmD, BCPS []  Verlan Friends, PharmD []  Mervyn Gay, PharmD, BCPS [x]  Ivery Quale,  PharmD  [facility] []  Len Childs, PharmD []  Greer Pickerel, PharmD []  Adalberto Cole, PharmD []  Perlie Gold, Rph []  Lonell Face) Jean Rosenthal, PharmD []  Earl Many, PharmD []  Junita Push, PharmD []  Dorna Leitz, PharmD []  Terrilee Files, PharmD []  Lynann Beaver, PharmD []  Keturah Barre, PharmD []  Loralee Pacas, PharmD []  Bernadene Person, PharmD   Positive urine culture Treated with Cefpodoxime Proxetil, organism sensitive to the same and no further patient follow-up is required at this time.  Patsey Berthold 10/19/2023, 8:53 AM

## 2024-04-07 IMAGING — DX DG CHEST 2V
2 series · 2 of 2 positions shown · non-contrast
Comparison: Radiographs 10/26/2021 and 02/11/2021.

CLINICAL DATA: Intermittent sharp chest pain since yesterday.

EXAM:
CHEST - 2 VIEW

[chest pa]
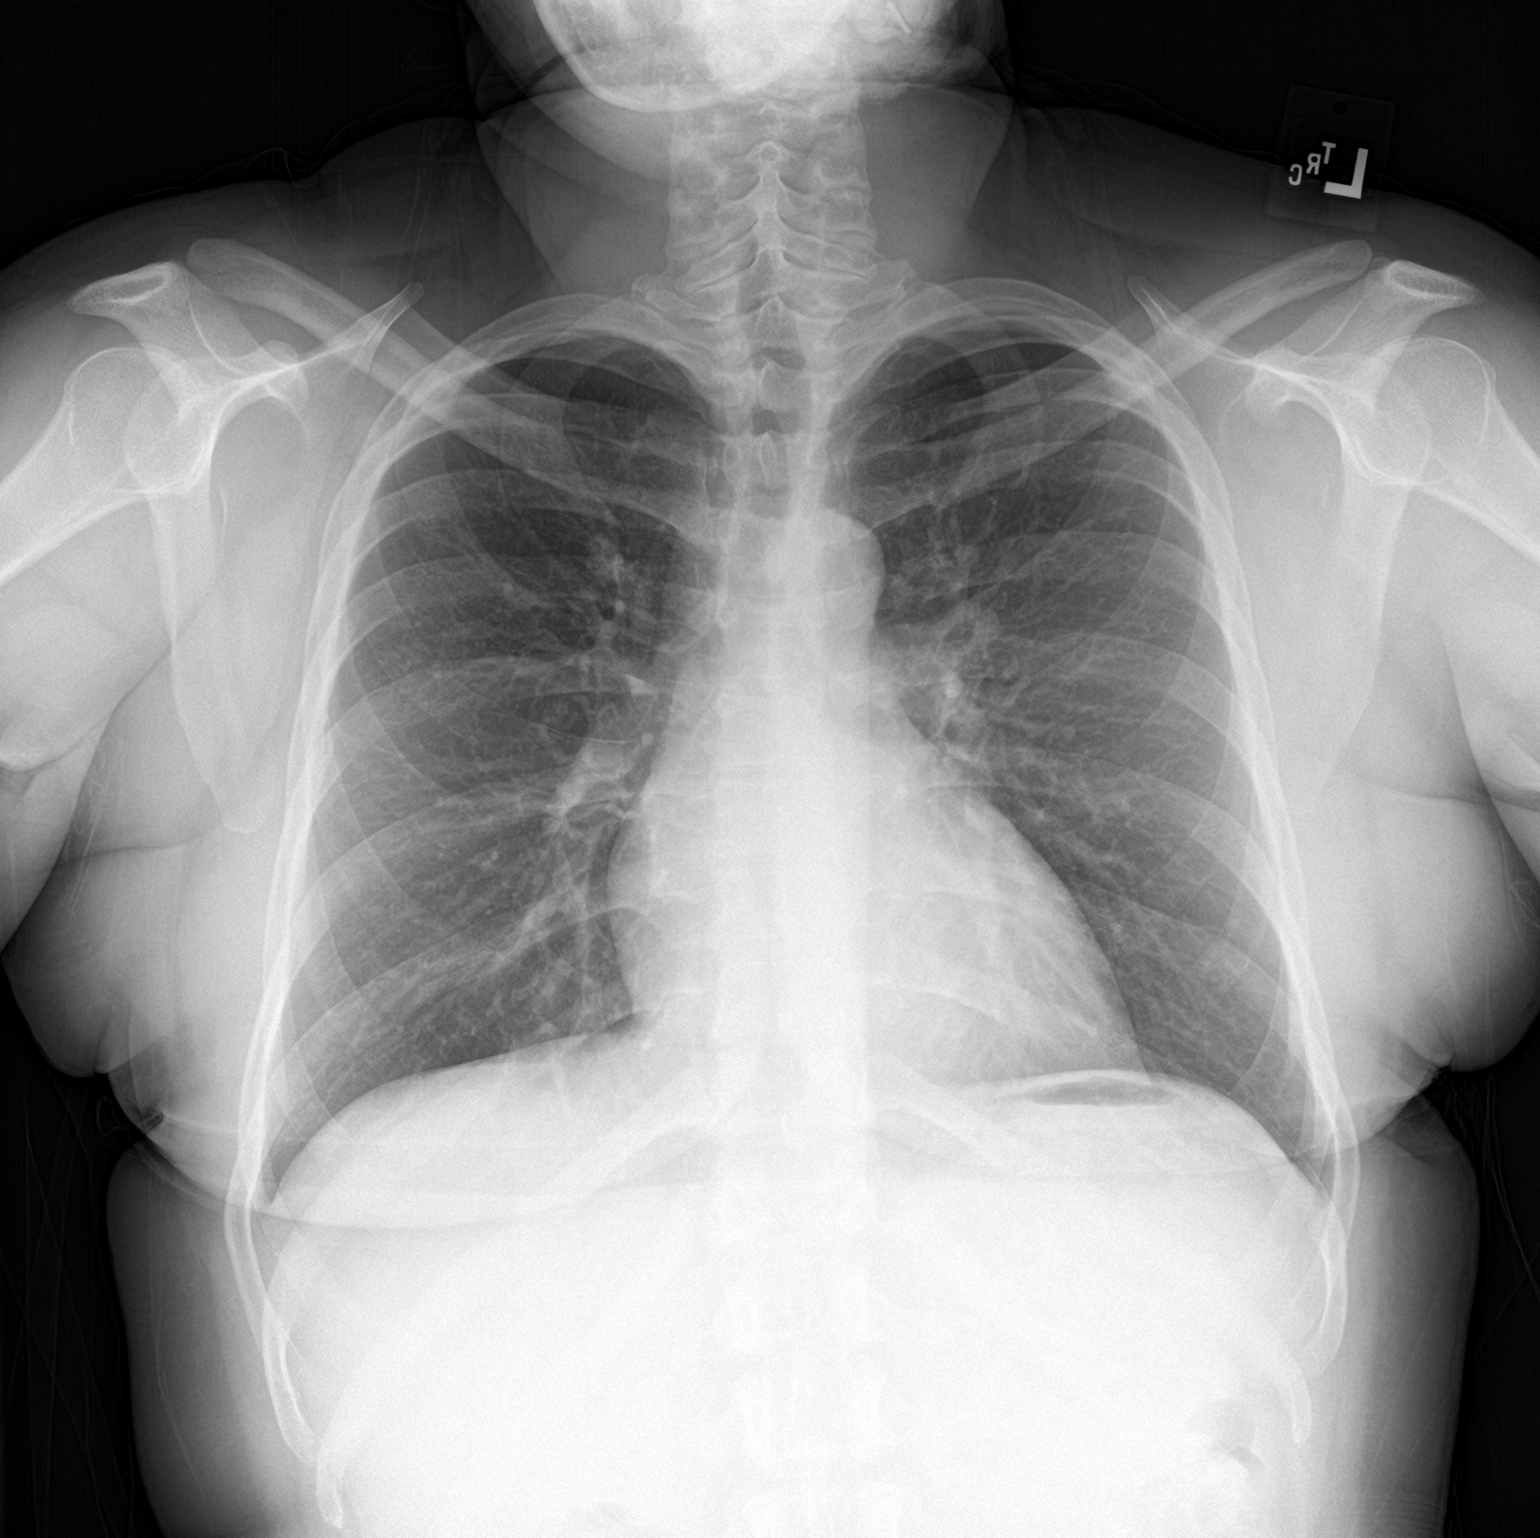

[chest lat]
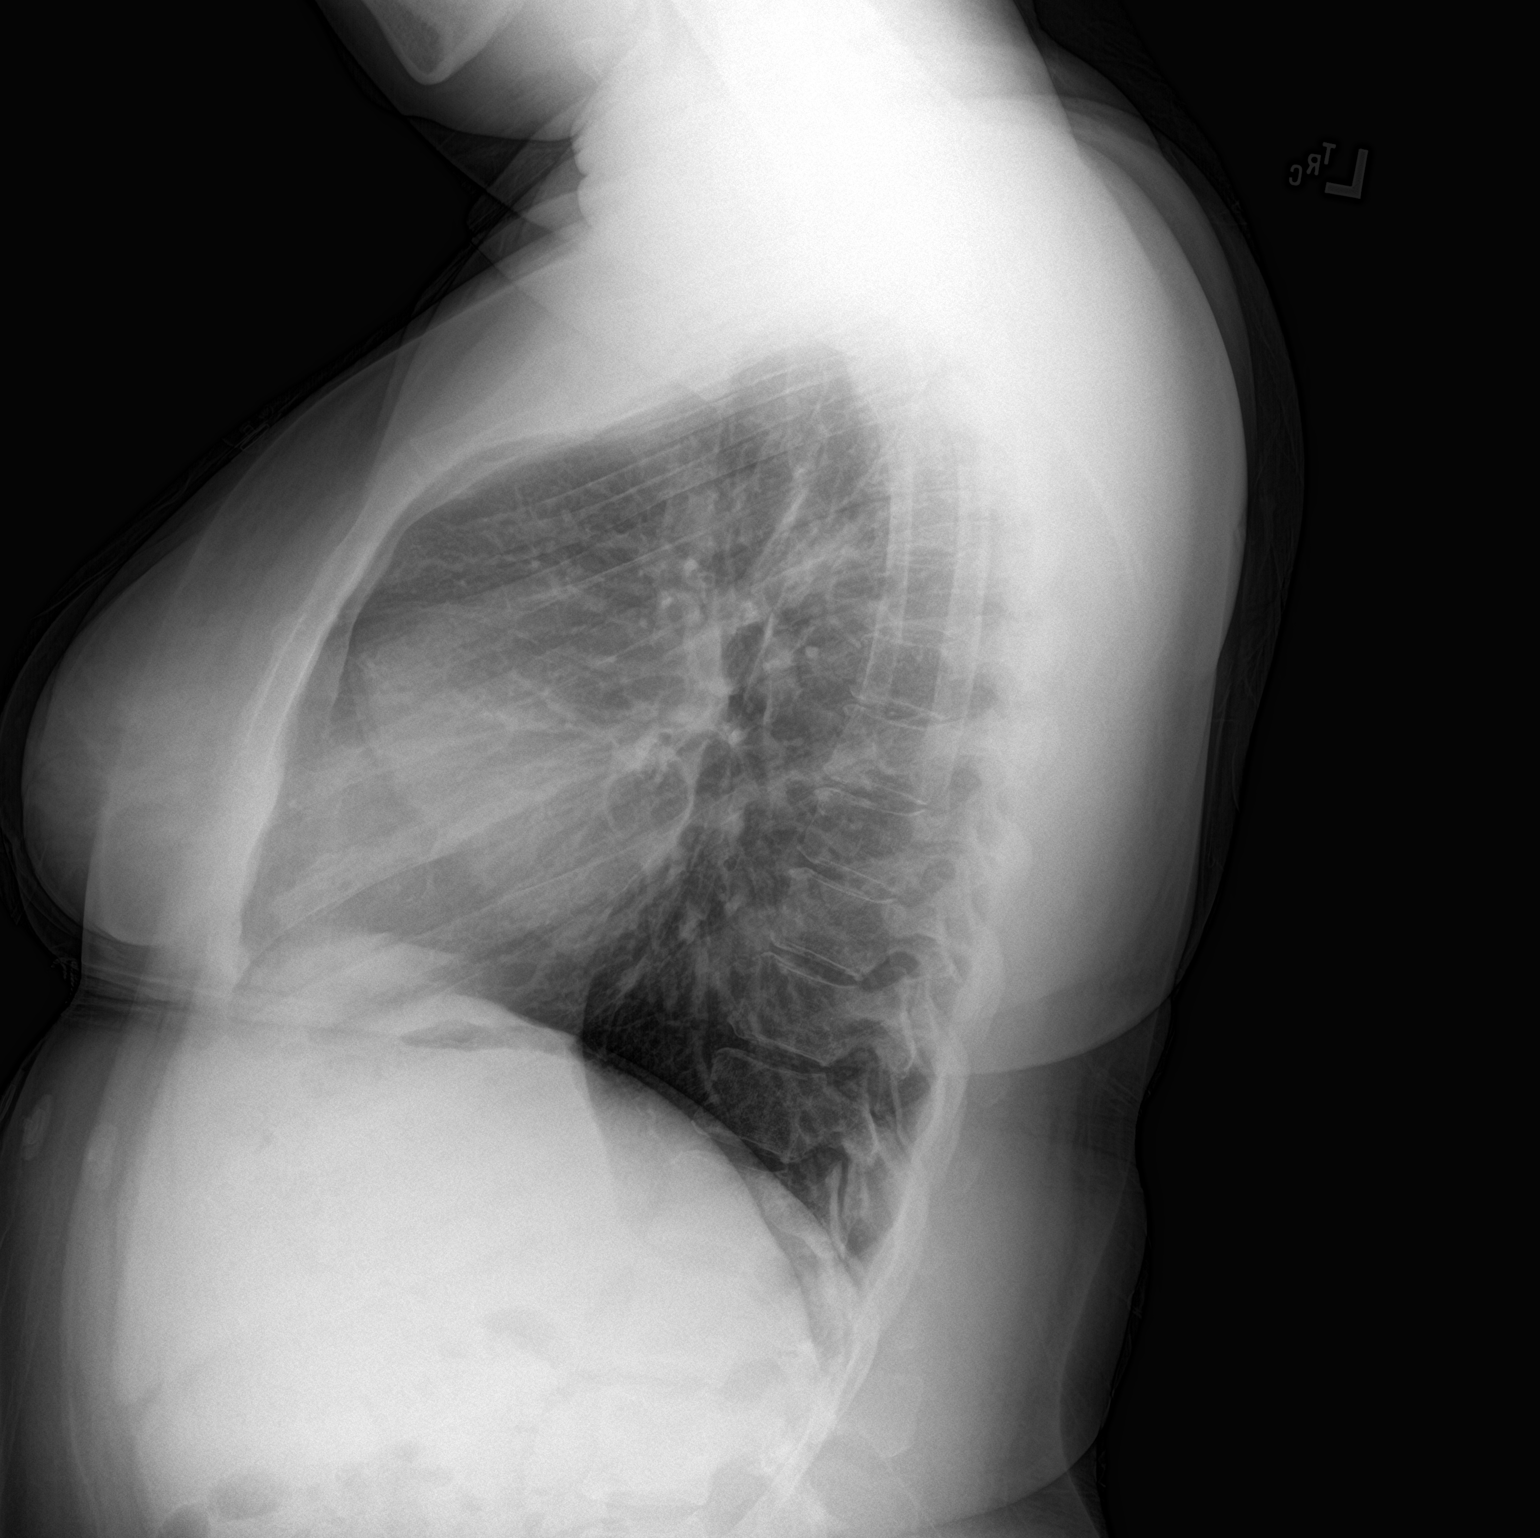

[2 of 2 positions shown; findings below may reference images not displayed]

FINDINGS: The heart size and mediastinal contours are normal. The lungs are
clear. There is no pleural effusion or pneumothorax. No acute
osseous findings are identified.
IMPRESSION: No active cardiopulmonary process.
